# Patient Record
Sex: Male | Born: 1975 | Race: Black or African American | Hispanic: No | Marital: Married | Smoking: Current some day smoker
Health system: Southern US, Community
[De-identification: ages and names within clinical notes are randomized; demographics above are authoritative.]

## PROBLEM LIST (undated history)

## (undated) DIAGNOSIS — K219 Gastro-esophageal reflux disease without esophagitis: Secondary | ICD-10-CM

## (undated) DIAGNOSIS — K222 Esophageal obstruction: Secondary | ICD-10-CM

## (undated) HISTORY — DX: Gastro-esophageal reflux disease without esophagitis: K21.9

## (undated) HISTORY — DX: Esophageal obstruction: K22.2

## (undated) HISTORY — PX: HAND SURGERY: SHX662

## (undated) HISTORY — PX: HEMORRHOID SURGERY: SHX153

## (undated) HISTORY — PX: ESOPHAGEAL DILATION: SHX303

---

## 2014-08-27 ENCOUNTER — Telehealth: Payer: Self-pay | Admitting: Behavioral Health

## 2014-08-27 ENCOUNTER — Encounter: Payer: Self-pay | Admitting: Behavioral Health

## 2014-08-27 NOTE — Telephone Encounter (Signed)
Pre-Visit Call completed with patient and chart updated.   Pre-Visit Info documented in Specialty Comments under [employer].    

## 2014-08-28 ENCOUNTER — Encounter: Payer: Self-pay | Admitting: Physician Assistant

## 2014-08-28 ENCOUNTER — Ambulatory Visit (INDEPENDENT_AMBULATORY_CARE_PROVIDER_SITE_OTHER): Payer: 59 | Admitting: Physician Assistant

## 2014-08-28 VITALS — BP 106/74 | HR 64 | Temp 97.7°F | Resp 16 | Ht 73.0 in | Wt 233.0 lb

## 2014-08-28 DIAGNOSIS — K219 Gastro-esophageal reflux disease without esophagitis: Secondary | ICD-10-CM | POA: Insufficient documentation

## 2014-08-28 DIAGNOSIS — Z125 Encounter for screening for malignant neoplasm of prostate: Secondary | ICD-10-CM | POA: Diagnosis not present

## 2014-08-28 DIAGNOSIS — Z0000 Encounter for general adult medical examination without abnormal findings: Secondary | ICD-10-CM | POA: Diagnosis not present

## 2014-08-28 DIAGNOSIS — Z8719 Personal history of other diseases of the digestive system: Secondary | ICD-10-CM | POA: Insufficient documentation

## 2014-08-28 LAB — COMPREHENSIVE METABOLIC PANEL
ALBUMIN: 4.2 g/dL (ref 3.5–5.2)
ALK PHOS: 61 U/L (ref 39–117)
ALT: 36 U/L (ref 0–53)
AST: 22 U/L (ref 0–37)
BUN: 16 mg/dL (ref 6–23)
CALCIUM: 9.2 mg/dL (ref 8.4–10.5)
CHLORIDE: 102 meq/L (ref 96–112)
CO2: 28 mEq/L (ref 19–32)
CREATININE: 1 mg/dL (ref 0.40–1.50)
GFR: 106.02 mL/min (ref >60.00)
Glucose, Bld: 95 mg/dL (ref 70–99)
POTASSIUM: 3.8 meq/L (ref 3.5–5.1)
Sodium: 136 mEq/L (ref 135–145)
TOTAL PROTEIN: 7 g/dL (ref 6.0–8.3)
Total Bilirubin: 0.5 mg/dL (ref 0.2–1.2)

## 2014-08-28 LAB — LIPID PANEL
CHOL/HDL RATIO: 5
Cholesterol: 174 mg/dL (ref 0–200)
HDL: 37.7 mg/dL — AB (ref >39.00)
LDL Cholesterol: 99 mg/dL (ref 0–99)
NONHDL: 136.74
Triglycerides: 189 mg/dL — ABNORMAL HIGH (ref 0.0–149.0)
VLDL: 37.8 mg/dL (ref 0.0–40.0)

## 2014-08-28 LAB — CBC
HEMATOCRIT: 41.8 % (ref 39.0–52.0)
Hemoglobin: 14.2 g/dL (ref 13.0–17.0)
MCHC: 34.1 g/dL (ref 30.0–36.0)
MCV: 93.5 fl (ref 78.0–100.0)
PLATELETS: 223 10*3/uL (ref 150.0–400.0)
RBC: 4.47 Mil/uL (ref 4.22–5.81)
RDW: 12.5 % (ref 11.5–15.5)
WBC: 4.9 10*3/uL (ref 4.0–10.5)

## 2014-08-28 LAB — PSA: PSA: 0.71 ng/mL (ref 0.10–4.00)

## 2014-08-28 LAB — TSH: TSH: 1.46 u[IU]/mL (ref 0.35–4.50)

## 2014-08-28 LAB — HEMOGLOBIN A1C: HEMOGLOBIN A1C: 4.7 % (ref 4.6–6.5)

## 2014-08-28 NOTE — Patient Instructions (Signed)
Please go to the lab for blood work.  I will call you with your results. If your blood work is normal we will follow-up yearly for physicals.  If anything is abnormal, we will treat you and get you in for a follow-up visit.  Please continue the Prilosec as directed.  Preventive Care for Adults A healthy lifestyle and preventive care can promote health and wellness. Preventive health guidelines for men include the following key practices:  A routine yearly physical is a good way to check with your health care provider about your health and preventative screening. It is a chance to share any concerns and updates on your health and to receive a thorough exam.  Visit your dentist for a routine exam and preventative care every 6 months. Brush your teeth twice a day and floss once a day. Good oral hygiene prevents tooth decay and gum disease.  The frequency of eye exams is based on your age, health, family medical history, use of contact lenses, and other factors. Follow your health care provider's recommendations for frequency of eye exams.  Eat a healthy diet. Foods such as vegetables, fruits, whole grains, low-fat dairy products, and lean protein foods contain the nutrients you need without too many calories. Decrease your intake of foods high in solid fats, added sugars, and salt. Eat the right amount of calories for you.Get information about a proper diet from your health care provider, if necessary.  Regular physical exercise is one of the most important things you can do for your health. Most adults should get at least 150 minutes of moderate-intensity exercise (any activity that increases your heart rate and causes you to sweat) each week. In addition, most adults need muscle-strengthening exercises on 2 or more days a week.  Maintain a healthy weight. The body mass index (BMI) is a screening tool to identify possible weight problems. It provides an estimate of body fat based on height and  weight. Your health care provider can find your BMI and can help you achieve or maintain a healthy weight.For adults 20 years and older:  A BMI below 18.5 is considered underweight.  A BMI of 18.5 to 24.9 is normal.  A BMI of 25 to 29.9 is considered overweight.  A BMI of 30 and above is considered obese.  Maintain normal blood lipids and cholesterol levels by exercising and minimizing your intake of saturated fat. Eat a balanced diet with plenty of fruit and vegetables. Blood tests for lipids and cholesterol should begin at age 48 and be repeated every 5 years. If your lipid or cholesterol levels are high, you are over 50, or you are at high risk for heart disease, you may need your cholesterol levels checked more frequently.Ongoing high lipid and cholesterol levels should be treated with medicines if diet and exercise are not working.  If you smoke, find out from your health care provider how to quit. If you do not use tobacco, do not start.  Lung cancer screening is recommended for adults aged 36-80 years who are at high risk for developing lung cancer because of a history of smoking. A yearly low-dose CT scan of the lungs is recommended for people who have at least a 30-pack-year history of smoking and are a current smoker or have quit within the past 15 years. A pack year of smoking is smoking an average of 1 pack of cigarettes a day for 1 year (for example: 1 pack a day for 30 years or 2 packs  a day for 15 years). Yearly screening should continue until the smoker has stopped smoking for at least 15 years. Yearly screening should be stopped for people who develop a health problem that would prevent them from having lung cancer treatment.  If you choose to drink alcohol, do not have more than 2 drinks per day. One drink is considered to be 12 ounces (355 mL) of beer, 5 ounces (148 mL) of wine, or 1.5 ounces (44 mL) of liquor.  Avoid use of street drugs. Do not share needles with anyone. Ask  for help if you need support or instructions about stopping the use of drugs.  High blood pressure causes heart disease and increases the risk of stroke. Your blood pressure should be checked at least every 1-2 years. Ongoing high blood pressure should be treated with medicines, if weight loss and exercise are not effective.  If you are 64-44 years old, ask your health care provider if you should take aspirin to prevent heart disease.  Diabetes screening involves taking a blood sample to check your fasting blood sugar level. This should be done once every 3 years, after age 25, if you are within normal weight and without risk factors for diabetes. Testing should be considered at a younger age or be carried out more frequently if you are overweight and have at least 1 risk factor for diabetes.  Colorectal cancer can be detected and often prevented. Most routine colorectal cancer screening begins at the age of 64 and continues through age 43. However, your health care provider may recommend screening at an earlier age if you have risk factors for colon cancer. On a yearly basis, your health care provider may provide home test kits to check for hidden blood in the stool. Use of a small camera at the end of a tube to directly examine the colon (sigmoidoscopy or colonoscopy) can detect the earliest forms of colorectal cancer. Talk to your health care provider about this at age 58, when routine screening begins. Direct exam of the colon should be repeated every 5-10 years through age 49, unless early forms of precancerous polyps or small growths are found.  People who are at an increased risk for hepatitis B should be screened for this virus. You are considered at high risk for hepatitis B if:  You were born in a country where hepatitis B occurs often. Talk with your health care provider about which countries are considered high risk.  Your parents were born in a high-risk country and you have not received a  shot to protect against hepatitis B (hepatitis B vaccine).  You have HIV or AIDS.  You use needles to inject street drugs.  You live with, or have sex with, someone who has hepatitis B.  You are a man who has sex with other men (MSM).  You get hemodialysis treatment.  You take certain medicines for conditions such as cancer, organ transplantation, and autoimmune conditions.  Hepatitis C blood testing is recommended for all people born from [birth year] through 1965 and any individual with known risks for hepatitis C.  Practice safe sex. Use condoms and avoid high-risk sexual practices to reduce the spread of sexually transmitted infections (STIs). STIs include gonorrhea, chlamydia, syphilis, trichomonas, herpes, HPV, and human immunodeficiency virus (HIV). Herpes, HIV, and HPV are viral illnesses that have no cure. They can result in disability, cancer, and death.  If you are at risk of being infected with HIV, it is recommended that you take a  prescription medicine daily to prevent HIV infection. This is called preexposure prophylaxis (PrEP). You are considered at risk if:  You are a man who has sex with other men (MSM) and have other risk factors.  You are a heterosexual man, are sexually active, and are at increased risk for HIV infection.  You take drugs by injection.  You are sexually active with a partner who has HIV.  Talk with your health care provider about whether you are at high risk of being infected with HIV. If you choose to begin PrEP, you should first be tested for HIV. You should then be tested every 3 months for as long as you are taking PrEP.  A one-time screening for abdominal aortic aneurysm (AAA) and surgical repair of large AAAs by ultrasound are recommended for men ages 49 to 45 years who are current or former smokers.  Healthy men should no longer receive prostate-specific antigen (PSA) blood tests as part of routine cancer screening. Talk with your health care  provider about prostate cancer screening.  Testicular cancer screening is not recommended for adult males who have no symptoms. Screening includes self-exam, a health care provider exam, and other screening tests. Consult with your health care provider about any symptoms you have or any concerns you have about testicular cancer.  Use sunscreen. Apply sunscreen liberally and repeatedly throughout the day. You should seek shade when your shadow is shorter than you. Protect yourself by wearing long sleeves, pants, a wide-brimmed hat, and sunglasses year round, whenever you are outdoors.  Once a month, do a whole-body skin exam, using a mirror to look at the skin on your back. Tell your health care provider about new moles, moles that have irregular borders, moles that are larger than a pencil eraser, or moles that have changed in shape or color.  Stay current with required vaccines (immunizations).  Influenza vaccine. All adults should be immunized every year.  Tetanus, diphtheria, and acellular pertussis (Td, Tdap) vaccine. An adult who has not previously received Tdap or who does not know his vaccine status should receive 1 dose of Tdap. This initial dose should be followed by tetanus and diphtheria toxoids (Td) booster doses every 10 years. Adults with an unknown or incomplete history of completing a 3-dose immunization series with Td-containing vaccines should begin or complete a primary immunization series including a Tdap dose. Adults should receive a Td booster every 10 years.  Varicella vaccine. An adult without evidence of immunity to varicella should receive 2 doses or a second dose if he has previously received 1 dose.  Human papillomavirus (HPV) vaccine. Males aged 33-21 years who have not received the vaccine previously should receive the 3-dose series. Males aged 22-26 years may be immunized. Immunization is recommended through the age of 38 years for any male who has sex with males and  did not get any or all doses earlier. Immunization is recommended for any person with an immunocompromised condition through the age of 78 years if he did not get any or all doses earlier. During the 3-dose series, the second dose should be obtained 4-8 weeks after the first dose. The third dose should be obtained 24 weeks after the first dose and 16 weeks after the second dose.  Zoster vaccine. One dose is recommended for adults aged 74 years or older unless certain conditions are present.  Measles, mumps, and rubella (MMR) vaccine. Adults born before [birth year] generally are considered immune to measles and mumps. Adults born in [birth year]  or later should have 1 or more doses of MMR vaccine unless there is a contraindication to the vaccine or there is laboratory evidence of immunity to each of the three diseases. A routine second dose of MMR vaccine should be obtained at least 28 days after the first dose for students attending postsecondary schools, health care workers, or international travelers. People who received inactivated measles vaccine or an unknown type of measles vaccine during 1963-1967 should receive 2 doses of MMR vaccine. People who received inactivated mumps vaccine or an unknown type of mumps vaccine before 1979 and are at high risk for mumps infection should consider immunization with 2 doses of MMR vaccine. Unvaccinated health care workers born before [birth year] who lack laboratory evidence of measles, mumps, or rubella immunity or laboratory confirmation of disease should consider measles and mumps immunization with 2 doses of MMR vaccine or rubella immunization with 1 dose of MMR vaccine.  Pneumococcal 13-valent conjugate (PCV13) vaccine. When indicated, a person who is uncertain of his immunization history and has no record of immunization should receive the PCV13 vaccine. An adult aged 17 years or older who has certain medical conditions and has not been previously immunized should receive 1 dose of  PCV13 vaccine. This PCV13 should be followed with a dose of pneumococcal polysaccharide (PPSV23) vaccine. The PPSV23 vaccine dose should be obtained at least 8 weeks after the dose of PCV13 vaccine. An adult aged 5 years or older who has certain medical conditions and previously received 1 or more doses of PPSV23 vaccine should receive 1 dose of PCV13. The PCV13 vaccine dose should be obtained 1 or more years after the last PPSV23 vaccine dose.  Pneumococcal polysaccharide (PPSV23) vaccine. When PCV13 is also indicated, PCV13 should be obtained first. All adults aged 49 years and older should be immunized. An adult younger than age 59 years who has certain medical conditions should be immunized. Any person who resides in a nursing home or long-term care facility should be immunized. An adult smoker should be immunized. People with an immunocompromised condition and certain other conditions should receive both PCV13 and PPSV23 vaccines. People with human immunodeficiency virus (HIV) infection should be immunized as soon as possible after diagnosis. Immunization during chemotherapy or radiation therapy should be avoided. Routine use of PPSV23 vaccine is not recommended for American Indians, [place] Natives, or people younger than 65 years unless there are medical conditions that require PPSV23 vaccine. When indicated, people who have unknown immunization and have no record of immunization should receive PPSV23 vaccine. One-time revaccination 5 years after the first dose of PPSV23 is recommended for people aged 19-64 years who have chronic kidney failure, nephrotic syndrome, asplenia, or immunocompromised conditions. People who received 1-2 doses of PPSV23 before age 68 years should receive another dose of PPSV23 vaccine at age 34 years or later if at least 5 years have passed since the previous dose. Doses of PPSV23 are not needed for people immunized with PPSV23 at or after age 44 years.  Meningococcal vaccine.  Adults with asplenia or persistent complement component deficiencies should receive 2 doses of quadrivalent meningococcal conjugate (MenACWY-D) vaccine. The doses should be obtained at least 2 months apart. Microbiologists working with certain meningococcal bacteria, [place] recruits, people at risk during an outbreak, and people who travel to or live in countries with a high rate of meningitis should be immunized. A first-year college student up through age 11 years who is living in a residence hall should receive a dose if he  did not receive a dose on or after his 16th birthday. Adults who have certain high-risk conditions should receive one or more doses of vaccine.  Hepatitis A vaccine. Adults who wish to be protected from this disease, have certain high-risk conditions, work with hepatitis A-infected animals, work in hepatitis A research labs, or travel to or work in countries with a high rate of hepatitis A should be immunized. Adults who were previously unvaccinated and who anticipate close contact with an international adoptee during the first 60 days after arrival in the [place] from a country with a high rate of hepatitis A should be immunized.  Hepatitis B vaccine. Adults should be immunized if they wish to be protected from this disease, have certain high-risk conditions, may be exposed to blood or other infectious body fluids, are household contacts or sex partners of hepatitis B positive people, are clients or workers in certain care facilities, or travel to or work in countries with a high rate of hepatitis B.  Haemophilus influenzae type b (Hib) vaccine. A previously unvaccinated person with asplenia or sickle cell disease or having a scheduled splenectomy should receive 1 dose of Hib vaccine. Regardless of previous immunization, a recipient of a hematopoietic stem cell transplant should receive a 3-dose series 6-12 months after his successful transplant. Hib vaccine is not recommended  for adults with HIV infection. Preventive Service / Frequency Ages 34 to 34  Blood pressure check.** / Every 1 to 2 years.  Lipid and cholesterol check.** / Every 5 years beginning at age 76.  Hepatitis C blood test.** / For any individual with known risks for hepatitis C.  Skin self-exam. / Monthly.  Influenza vaccine. / Every year.  Tetanus, diphtheria, and acellular pertussis (Tdap, Td) vaccine.** / Consult your health care provider. 1 dose of Td every 10 years.  Varicella vaccine.** / Consult your health care provider.  HPV vaccine. / 3 doses over 6 months, if 41 or younger.  Measles, mumps, rubella (MMR) vaccine.** / You need at least 1 dose of MMR if you were born in 1957 or later. You may also need a second dose.  Pneumococcal 13-valent conjugate (PCV13) vaccine.** / Consult your health care provider.  Pneumococcal polysaccharide (PPSV23) vaccine.** / 1 to 2 doses if you smoke cigarettes or if you have certain conditions.  Meningococcal vaccine.** / 1 dose if you are age 14 to 59 years and a Market researcher living in a residence hall, or have one of several medical conditions. You may also need additional booster doses.  Hepatitis A vaccine.** / Consult your health care provider.  Hepatitis B vaccine.** / Consult your health care provider.  Haemophilus influenzae type b (Hib) vaccine.** / Consult your health care provider. Ages 44 to 68  Blood pressure check.** / Every 1 to 2 years.  Lipid and cholesterol check.** / Every 5 years beginning at age 56.  Lung cancer screening. / Every year if you are aged 5-80 years and have a 30-pack-year history of smoking and currently smoke or have quit within the past 15 years. Yearly screening is stopped once you have quit smoking for at least 15 years or develop a health problem that would prevent you from having lung cancer treatment.  Fecal occult blood test (FOBT) of stool. / Every year beginning at age 13 and  continuing until age 39. You may not have to do this test if you get a colonoscopy every 10 years.  Flexible sigmoidoscopy** or colonoscopy.** / Every 5  years for a flexible sigmoidoscopy or every 10 years for a colonoscopy beginning at age 42 and continuing until age 75.  Hepatitis C blood test.** / For all people born from [birth year] through 1965 and any individual with known risks for hepatitis C.  Skin self-exam. / Monthly.  Influenza vaccine. / Every year.  Tetanus, diphtheria, and acellular pertussis (Tdap/Td) vaccine.** / Consult your health care provider. 1 dose of Td every 10 years.  Varicella vaccine.** / Consult your health care provider.  Zoster vaccine.** / 1 dose for adults aged 29 years or older.  Measles, mumps, rubella (MMR) vaccine.** / You need at least 1 dose of MMR if you were born in 1959 or later. You may also need a second dose.  Pneumococcal 13-valent conjugate (PCV13) vaccine.** / Consult your health care provider.  Pneumococcal polysaccharide (PPSV23) vaccine.** / 1 to 2 doses if you smoke cigarettes or if you have certain conditions.  Meningococcal vaccine.** / Consult your health care provider.  Hepatitis A vaccine.** / Consult your health care provider.  Hepatitis B vaccine.** / Consult your health care provider.  Haemophilus influenzae type b (Hib) vaccine.** / Consult your health care provider. Ages 59 and over  Blood pressure check.** / Every 1 to 2 years.  Lipid and cholesterol check.**/ Every 5 years beginning at age 66.  Lung cancer screening. / Every year if you are aged 61-80 years and have a 30-pack-year history of smoking and currently smoke or have quit within the past 15 years. Yearly screening is stopped once you have quit smoking for at least 15 years or develop a health problem that would prevent you from having lung cancer treatment.  Fecal occult blood test (FOBT) of stool. / Every year beginning at age 25 and continuing until age 57. You  may not have to do this test if you get a colonoscopy every 10 years.  Flexible sigmoidoscopy** or colonoscopy.** / Every 5 years for a flexible sigmoidoscopy or every 10 years for a colonoscopy beginning at age 47 and continuing until age 37.  Hepatitis C blood test.** / For all people born from [birth year] through 1967 and any individual with known risks for hepatitis C.  Abdominal aortic aneurysm (AAA) screening.** / A one-time screening for ages 59 to 72 years who are current or former smokers.  Skin self-exam. / Monthly.  Influenza vaccine. / Every year.  Tetanus, diphtheria, and acellular pertussis (Tdap/Td) vaccine.** / 1 dose of Td every 10 years.  Varicella vaccine.** / Consult your health care provider.  Zoster vaccine.** / 1 dose for adults aged 87 years or older.  Pneumococcal 13-valent conjugate (PCV13) vaccine.** / Consult your health care provider.  Pneumococcal polysaccharide (PPSV23) vaccine.** / 1 dose for all adults aged 30 years and older.  Meningococcal vaccine.** / Consult your health care provider.  Hepatitis A vaccine.** / Consult your health care provider.  Hepatitis B vaccine.** / Consult your health care provider.  Haemophilus influenzae type b (Hib) vaccine.** / Consult your health care provider. **Family history and personal history of risk and conditions may change your health care provider's recommendations. Document Released: 02/23/2001 Document Revised: 01/02/2013 Document Reviewed: 05/25/2010 [facility] Patient Information 2015 [place], [place]. This information is not intended to replace advice given to you by your health care provider. Make sure you discuss any questions you have with your health care provider.

## 2014-08-28 NOTE — Progress Notes (Signed)
Patient presents to clinic today to establish care. Patient currently on Prilosec as needed for GERD with good relief in symptoms. Is requesting CPE. Is fasting for labs.   Health Maintenance: Dental -- up-to-date Vision -- up-to-date Immunizations -- Tetanus up-to-date per patient. Will obtain immunization records. Colonoscopy -- 2012 - Hemorrhoids only.  Past Medical History  Diagnosis Date  . GERD (gastroesophageal reflux disease)   . Esophageal stricture     Past Surgical History  Procedure Laterality Date  . Hand surgery Right   . Hemorrhoid surgery    . Esophageal dilation      Current Outpatient Prescriptions on File Prior to Visit  Medication Sig Dispense Refill  . OMEPRAZOLE PO Take by mouth daily.     No current facility-administered medications on file prior to visit.    No Known Allergies  Family History  Problem Relation Age of Onset  . Healthy Child     x 2    Social History   Social History  . Marital Status: Married    Spouse Name: N/A  . Number of Children: 2  . Years of Education: N/A   Occupational History  . Education    Social History Main Topics  . Smoking status: Current Some Day Smoker -- 0.01 packs/day for 20 years    Types: Cigarettes  . Smokeless tobacco: Never Used  . Alcohol Use: 0.0 oz/week    0 Standard drinks or equivalent per week     Comment: rare  . Drug Use: No  . Sexual Activity:    Partners: Female   Other Topics Concern  . Not on file   Social History Narrative   Review of Systems  Constitutional: Negative for fever and weight loss.  HENT: Negative for ear discharge, ear pain, hearing loss and tinnitus.   Eyes: Negative for blurred vision, double vision, photophobia and pain.  Respiratory: Negative for cough and shortness of breath.   Cardiovascular: Negative for chest pain and palpitations.  Gastrointestinal: Negative for heartburn, nausea, vomiting, abdominal pain, diarrhea, constipation, blood in  stool and melena.  Genitourinary: Negative for dysuria, urgency, frequency, hematuria and flank pain.  Musculoskeletal: Negative for falls.  Neurological: Negative for dizziness, loss of consciousness and headaches.  Endo/Heme/Allergies: Negative for environmental allergies.  Psychiatric/Behavioral: Negative for depression, suicidal ideas, hallucinations and substance abuse. The patient is not nervous/anxious and does not have insomnia.     BP 106/74 mmHg  Pulse 64  Temp(Src) 97.7 F (36.5 C) (Oral)  Resp 16  Ht  (1.854 m)  Wt 233 lb (105.688 kg)  BMI 30.75 kg/m2  SpO2 98%  Physical Exam  Constitutional: He is oriented to person, place, and time and well-developed, well-nourished, and in no distress.  HENT:  Head: Normocephalic and atraumatic.  Right Ear: External ear normal.  Left Ear: External ear normal.  Nose: Nose normal.  Mouth/Throat: Oropharynx is clear and moist. No oropharyngeal exudate.  Eyes: Conjunctivae and EOM are normal. Pupils are equal, round, and reactive to light.  Neck: Neck supple. No thyromegaly present.  Cardiovascular: Normal rate, regular rhythm, normal heart sounds and intact distal pulses.   Pulmonary/Chest: Effort normal and breath sounds normal. No respiratory distress. He has no wheezes. He has no rales. He exhibits no tenderness.  Abdominal: Soft. Bowel sounds are normal. He exhibits no distension and no mass. There is no tenderness. There is no rebound and no guarding.  Genitourinary: Testes/scrotum normal.  Deferred.  Lymphadenopathy:    He  has no cervical adenopathy.  Neurological: He is alert and oriented to person, place, and time.  Skin: Skin is warm and dry. No rash noted.  Psychiatric: Affect normal.  Vitals reviewed.  Assessment/Plan: Prostate cancer screening DRE deferred. Asymptomatic. Will obtain PSA today.  Visit for preventive health examination Depression screen negative. Health Maintenance reviewed -- Declines tetanus.  Colonoscopy 2012. Preventive schedule discussed and handout given in AVS. Will obtain fasting labs today.

## 2014-08-28 NOTE — Progress Notes (Signed)
Pre visit review using our clinic review tool, if applicable. No additional management support is needed unless otherwise documented below in the visit note. 

## 2014-08-29 LAB — URINALYSIS, ROUTINE W REFLEX MICROSCOPIC
Bilirubin Urine: NEGATIVE
HGB URINE DIPSTICK: NEGATIVE
Ketones, ur: NEGATIVE
LEUKOCYTES UA: NEGATIVE
NITRITE: NEGATIVE
PH: 6.5 (ref 5.0–8.0)
RBC / HPF: NONE SEEN (ref 0–?)
Specific Gravity, Urine: 1.02 (ref 1.000–1.030)
TOTAL PROTEIN, URINE-UPE24: NEGATIVE
URINE GLUCOSE: NEGATIVE
Urobilinogen, UA: 0.2 (ref 0.0–1.0)
WBC, UA: NONE SEEN (ref 0–?)

## 2014-08-31 ENCOUNTER — Encounter: Payer: Self-pay | Admitting: Physician Assistant

## 2014-08-31 DIAGNOSIS — Z0000 Encounter for general adult medical examination without abnormal findings: Secondary | ICD-10-CM | POA: Insufficient documentation

## 2014-08-31 DIAGNOSIS — Z125 Encounter for screening for malignant neoplasm of prostate: Secondary | ICD-10-CM | POA: Insufficient documentation

## 2014-08-31 NOTE — Assessment & Plan Note (Signed)
Depression screen negative. Health Maintenance reviewed -- Declines tetanus. Colonoscopy 2012. Preventive schedule discussed and handout given in AVS. Will obtain fasting labs today.

## 2014-08-31 NOTE — Assessment & Plan Note (Signed)
DRE deferred. Asymptomatic. Will obtain PSA today.

## 2016-03-12 ENCOUNTER — Ambulatory Visit (INDEPENDENT_AMBULATORY_CARE_PROVIDER_SITE_OTHER): Payer: BLUE CROSS/BLUE SHIELD | Admitting: Physician Assistant

## 2016-03-12 ENCOUNTER — Encounter: Payer: Self-pay | Admitting: Physician Assistant

## 2016-03-12 VITALS — BP 118/72 | HR 73 | Ht 73.0 in | Wt 233.0 lb

## 2016-03-12 DIAGNOSIS — Z6830 Body mass index (BMI) 30.0-30.9, adult: Secondary | ICD-10-CM

## 2016-03-12 DIAGNOSIS — E6609 Other obesity due to excess calories: Secondary | ICD-10-CM

## 2016-03-12 MED ORDER — PHENTERMINE HCL 37.5 MG PO TABS: 37.5000 mg | ORAL_TABLET | Freq: Every day | ORAL | 0 refills | Status: DC

## 2016-03-14 ENCOUNTER — Encounter: Payer: Self-pay | Admitting: Physician Assistant

## 2016-03-14 DIAGNOSIS — Z6830 Body mass index (BMI) 30.0-30.9, adult: Secondary | ICD-10-CM | POA: Insufficient documentation

## 2016-03-14 NOTE — Progress Notes (Signed)
   Subjective:    Patient ID: Jeffrey Gregory, male    DOB: July 10, 1975, 41 y.o.   MRN: [id number]  HPI Pt is a 41 yo male who presents to the clinic to discuss weight loss. He has been working out 3-5 times a week and limiting carbs/sugars with no weight loss. He would like help. Denies any hx of cardiac arrhthymias, thyroid issues.    Review of Systems  All other systems reviewed and are negative.      Objective:   Physical Exam  Constitutional: He is oriented to person, place, and time. He appears well-developed and well-nourished.  HENT:  Head: Normocephalic and atraumatic.  Cardiovascular: Normal rate, regular rhythm and normal heart sounds.   Pulmonary/Chest: Effort normal and breath sounds normal.  Neurological: He is alert and oriented to person, place, and time.  Psychiatric: He has a normal mood and affect. His behavior is normal.          Assessment & Plan:  Marland Kitchen.Marland Kitchen.Diagnoses and all orders for this visit:  Class 1 obesity due to excess calories without serious comorbidity in adult, unspecified BMI -     phentermine (ADIPEX-P) 37.5 MG tablet; Take 1 tablet (37.5 mg total) by mouth daily before breakfast.  BMI 30.0-30.9,adult -     phentermine (ADIPEX-P) 37.5 MG tablet; Take 1 tablet (37.5 mg total) by mouth daily before breakfast.   Discussed medication doses and side effects.  Follow up in 1 month nurse visit. Discussed diet and exercise.

## 2016-04-16 ENCOUNTER — Ambulatory Visit: Payer: BLUE CROSS/BLUE SHIELD

## 2016-04-20 ENCOUNTER — Ambulatory Visit (INDEPENDENT_AMBULATORY_CARE_PROVIDER_SITE_OTHER): Payer: BLUE CROSS/BLUE SHIELD | Admitting: Physician Assistant

## 2016-04-20 DIAGNOSIS — Z6830 Body mass index (BMI) 30.0-30.9, adult: Secondary | ICD-10-CM | POA: Diagnosis not present

## 2016-04-20 DIAGNOSIS — E6609 Other obesity due to excess calories: Secondary | ICD-10-CM

## 2016-04-20 MED ORDER — PHENTERMINE HCL 37.5 MG PO TABS: 37.5000 mg | ORAL_TABLET | Freq: Every day | ORAL | 0 refills | Status: DC

## 2016-04-20 NOTE — Progress Notes (Signed)
Pt is here for BP and weight check.  Denies trouble sleeping, palpitations, and medication problems.   Patient has lost weight.  A refill for phentermine will be faxed to the pharmacy.  Pt advised to schedule a follow up with nurse in 30 days.

## 2016-06-04 ENCOUNTER — Other Ambulatory Visit: Payer: Self-pay | Admitting: Physician Assistant

## 2016-06-04 DIAGNOSIS — E6609 Other obesity due to excess calories: Secondary | ICD-10-CM

## 2016-06-04 DIAGNOSIS — Z6830 Body mass index (BMI) 30.0-30.9, adult: Secondary | ICD-10-CM

## 2016-06-04 MED ORDER — PHENTERMINE HCL 37.5 MG PO TABS: 37.5000 mg | ORAL_TABLET | Freq: Every day | ORAL | 0 refills | Status: DC

## 2016-06-04 NOTE — Progress Notes (Signed)
Pt needs refill on phentermine. Doing well. No side effects. Losing weight. Down 6lbs. No side effects. Will refill for one month. Tandy GawJade Josmar Messimer PA_C

## 2016-11-09 ENCOUNTER — Ambulatory Visit (INDEPENDENT_AMBULATORY_CARE_PROVIDER_SITE_OTHER): Payer: 59

## 2016-11-09 ENCOUNTER — Encounter: Payer: Self-pay | Admitting: Sports Medicine

## 2016-11-09 ENCOUNTER — Ambulatory Visit (INDEPENDENT_AMBULATORY_CARE_PROVIDER_SITE_OTHER): Payer: 59 | Admitting: Sports Medicine

## 2016-11-09 DIAGNOSIS — Y9367 Activity, basketball: Secondary | ICD-10-CM | POA: Diagnosis not present

## 2016-11-09 DIAGNOSIS — B351 Tinea unguium: Secondary | ICD-10-CM

## 2016-11-09 DIAGNOSIS — S6991XA Unspecified injury of right wrist, hand and finger(s), initial encounter: Secondary | ICD-10-CM | POA: Diagnosis not present

## 2016-11-09 MED ORDER — MELOXICAM 15 MG PO TABS: ORAL_TABLET | ORAL | 3 refills | Status: DC

## 2016-11-09 MED ORDER — TERBINAFINE HCL 250 MG PO TABS: 250.0000 mg | ORAL_TABLET | Freq: Every day | ORAL | 1 refills | Status: DC

## 2016-11-09 NOTE — Progress Notes (Signed)
   Subjective:    I'm seeing this patient as a consultation for: Tandy GawJade Breeback, PA-C  CC: Right thumb injury  HPI: This is a pleasant 41 year old male, he has a history of a right first metacarpal fracture with ORIF.  While playing basketball about 6 days ago he jammed his thumb, unaware of which direction it went but he developed immediate swelling, pain, no bruising.  Pain is localized at the radial aspect of the first MCP.  He also complains of onychomycosis which responded to several months of Lamisil in the past.  Past medical history, Surgical history, Family history not pertinant except as noted below, Social history, Allergies, and medications have been entered into the medical record, reviewed, and no changes needed.   Review of Systems: No headache, visual changes, nausea, vomiting, diarrhea, constipation, dizziness, abdominal pain, skin rash, fevers, chills, night sweats, weight loss, swollen lymph nodes, body aches, joint swelling, muscle aches, chest pain, shortness of breath, mood changes, visual or auditory hallucinations.   Objective:   General: Well Developed, well nourished, and in no acute distress.  Neuro:  Extra-ocular muscles intact, able to move all 4 extremities, sensation grossly intact.  Deep tendon reflexes tested were normal. Psych: Alert and oriented, mood congruent with affect. ENT:  Ears and nose appear unremarkable.  Hearing grossly normal. Neck: Unremarkable overall appearance, trachea midline.  No visible thyroid enlargement. Eyes: Conjunctivae and lids appear unremarkable.  Pupils equal and round. Skin: Warm and dry, no rashes noted.  Cardiovascular: Pulses palpable, no extremity edema. Right hand: Swollen to thenar eminence, tenderness at the radial collateral ligament of the first MCP with joint laxity, ulnar collateral ligament is intact.  Good strength to flexion and extension at the MCP, as well as the IP joint.  X-rays personally reviewed, no new  fractures, he does have the bony callus from his previous fracture in the distant past.  Impression and Recommendations:   This case required medical decision making of moderate complexity.  Injury of right thumb Injury occurred 6 days ago, suspect radial collateral ligament injury of the right first MCP. Thumb spica brace, meloxicam, x-rays. Continue icing, return to see me in 2 weeks for this.  Onychomycosis Lamisil for 3-6 months. Keep follow-up with Tandy GawJade Breeback PA-C for this.  ___________________________________________ Ihor Austinhomas J. Benjamin Stainhekkekandam, M.D., ABFM., CAQSM. Primary Care and Sports Medicine [facility]  Adjunct Instructor of Family Medicine  [employer]

## 2016-11-09 NOTE — Assessment & Plan Note (Signed)
Lamisil for 3-6 months. Keep follow-up with Tandy GawJade Breeback PA-C for this.

## 2016-11-09 NOTE — Assessment & Plan Note (Signed)
Injury occurred 6 days ago, suspect radial collateral ligament injury of the right first MCP. Thumb spica brace, meloxicam, x-rays. Continue icing, return to see me in 2 weeks for this.

## 2017-06-02 ENCOUNTER — Other Ambulatory Visit: Payer: Self-pay | Admitting: Physician Assistant

## 2017-06-02 DIAGNOSIS — E6609 Other obesity due to excess calories: Secondary | ICD-10-CM

## 2017-06-02 DIAGNOSIS — Z6830 Body mass index (BMI) 30.0-30.9, adult: Secondary | ICD-10-CM

## 2017-06-02 MED ORDER — PHENTERMINE HCL 37.5 MG PO TABS: 37.5000 mg | ORAL_TABLET | Freq: Every day | ORAL | 0 refills | Status: DC

## 2017-06-02 NOTE — Progress Notes (Signed)
BP good. Would like to try phentermine again. Will restart. Follow up in one month for refills.

## 2017-09-07 ENCOUNTER — Other Ambulatory Visit: Payer: Self-pay | Admitting: Physician Assistant

## 2017-09-07 ENCOUNTER — Telehealth: Payer: Self-pay | Admitting: Physician Assistant

## 2017-09-07 DIAGNOSIS — E6609 Other obesity due to excess calories: Secondary | ICD-10-CM

## 2017-09-07 DIAGNOSIS — Z6830 Body mass index (BMI) 30.0-30.9, adult: Secondary | ICD-10-CM

## 2017-09-07 MED ORDER — PHENTERMINE HCL 37.5 MG PO TABS: 37.5000 mg | ORAL_TABLET | Freq: Every day | ORAL | 0 refills | Status: DC

## 2017-09-07 NOTE — Telephone Encounter (Signed)
Lost 18lbs and has 8lbs to go. No side effects. Checking BP at gym and 128/80's.

## 2018-02-08 ENCOUNTER — Telehealth: Payer: Self-pay | Admitting: Physician Assistant

## 2018-02-08 DIAGNOSIS — Z6830 Body mass index (BMI) 30.0-30.9, adult: Secondary | ICD-10-CM

## 2018-02-08 DIAGNOSIS — E6609 Other obesity due to excess calories: Secondary | ICD-10-CM

## 2018-02-08 MED ORDER — PHENTERMINE HCL 37.5 MG PO TABS: 37.5000 mg | ORAL_TABLET | Freq: Every day | ORAL | 0 refills | Status: DC

## 2018-02-08 NOTE — Telephone Encounter (Signed)
Pt has used phentermine in the past to jump start weight loss. Last refill was last summer.   NSR BP was 128/79 Pulse 64.   Starting weight 230.  Pt has tolerated well in the past.   Follow up in 1 month.

## 2018-04-26 ENCOUNTER — Telehealth: Payer: Self-pay | Admitting: Physician Assistant

## 2018-04-26 DIAGNOSIS — Z6830 Body mass index (BMI) 30.0-30.9, adult: Secondary | ICD-10-CM

## 2018-04-26 DIAGNOSIS — E6609 Other obesity due to excess calories: Secondary | ICD-10-CM

## 2018-04-26 MED ORDER — PHENTERMINE HCL 37.5 MG PO TABS: 37.5000 mg | ORAL_TABLET | Freq: Every day | ORAL | 0 refills | Status: DC

## 2018-04-26 NOTE — Telephone Encounter (Signed)
BP 128/79 pulse 64. Weigh 223. Refilled for next month. Discussion about not losing any weight.

## 2019-08-30 ENCOUNTER — Encounter (HOSPITAL_BASED_OUTPATIENT_CLINIC_OR_DEPARTMENT_OTHER): Payer: Self-pay | Admitting: Emergency Medicine

## 2019-08-30 ENCOUNTER — Other Ambulatory Visit: Payer: Self-pay

## 2019-08-30 ENCOUNTER — Emergency Department (HOSPITAL_BASED_OUTPATIENT_CLINIC_OR_DEPARTMENT_OTHER)
Admission: EM | Admit: 2019-08-30 | Discharge: 2019-08-30 | Disposition: A | Discharge status: Home / Self Care | Payer: Managed Care, Other (non HMO) | Attending: Emergency Medicine | Admitting: Emergency Medicine

## 2019-08-30 DIAGNOSIS — M25511 Pain in right shoulder: Secondary | ICD-10-CM | POA: Diagnosis not present

## 2019-08-30 DIAGNOSIS — R202 Paresthesia of skin: Secondary | ICD-10-CM | POA: Insufficient documentation

## 2019-08-30 DIAGNOSIS — M50122 Cervical disc disorder at C5-C6 level with radiculopathy: Secondary | ICD-10-CM | POA: Insufficient documentation

## 2019-08-30 DIAGNOSIS — M5412 Radiculopathy, cervical region: Secondary | ICD-10-CM

## 2019-08-30 DIAGNOSIS — M79603 Pain in arm, unspecified: Secondary | ICD-10-CM | POA: Diagnosis present

## 2019-08-30 DIAGNOSIS — M50123 Cervical disc disorder at C6-C7 level with radiculopathy: Secondary | ICD-10-CM | POA: Diagnosis not present

## 2019-08-30 DIAGNOSIS — F17210 Nicotine dependence, cigarettes, uncomplicated: Secondary | ICD-10-CM | POA: Insufficient documentation

## 2019-08-30 MED ORDER — OXYCODONE-ACETAMINOPHEN 10-325 MG PO TABS: 1.0000 | ORAL_TABLET | Freq: Four times a day (QID) | ORAL | 0 refills | Status: AC | PRN

## 2019-08-30 MED ORDER — NAPROXEN 375 MG PO TABS: ORAL_TABLET | ORAL | 0 refills | Status: AC

## 2019-08-30 MED ORDER — NAPROXEN 250 MG PO TABS
500.0000 mg | ORAL_TABLET | Freq: Once | ORAL | Status: AC
  Administered 2019-08-30: 500 mg via ORAL
  Filled 2019-08-30: qty 2

## 2019-08-30 MED ORDER — OXYCODONE-ACETAMINOPHEN 5-325 MG PO TABS
2.0000 | ORAL_TABLET | Freq: Once | ORAL | Status: AC
  Administered 2019-08-30: 2 via ORAL
  Filled 2019-08-30: qty 2

## 2019-08-30 NOTE — ED Provider Notes (Signed)
[facility] Provider Note: Lowella Dell, MD, FACEP  CSN: [id number] MRN: [id number] ARRIVAL: 08/30/19 at 0333 ROOM: [facility]10/[facility]10   CHIEF COMPLAINT  Arm Pain   HISTORY OF PRESENT ILLNESS  08/30/19 4:46 AM Jeffrey Gregory is a 44 y.o. male with about 2 months of pain on the right side of his neck radiating to his right shoulder and into his right hand.  This is acutely worsened for the past 2 days and he is having paresthesias in the fingers of the C6 and C7 dermatomes.  Pain has been a 10 out of 10 at times but currently he rates it as a 6 out of 10.  It is worse with rotation or leaning of his head to the left.  He has some mild weakness of the right upper extremity as well.  He has had no relief with over-the-counter medications.   Past Medical History:  Diagnosis Date  . Esophageal stricture   . GERD (gastroesophageal reflux disease)     Past Surgical History:  Procedure Laterality Date  . ESOPHAGEAL DILATION    . HAND SURGERY Right   . HEMORRHOID SURGERY      Family History  Problem Relation Age of Onset  . Healthy Child        x 2    Social History   Tobacco Use  . Smoking status: Current Some Day Smoker    Packs/day: 0.01    Years: 20.00    Pack years: 0.20    Types: Cigarettes  . Smokeless tobacco: Never Used  Substance Use Topics  . Alcohol use: Yes    Alcohol/week: 0.0 standard drinks    Comment: rare  . Drug use: No    Prior to Admission medications   Medication Sig Start Date End Date Taking? Authorizing Provider  naproxen (NAPROSYN) 375 MG tablet Take 1 tablet twice daily as needed for pain. 08/30/19   Margi Edmundson, MD  OMEPRAZOLE PO Take by mouth daily.    [provider]  oxyCODONE-acetaminophen (PERCOCET) 10-325 MG tablet Take 1 tablet by mouth every 6 (six) hours as needed (for severe pain). 08/30/19   Olivia Royse, MD  phentermine (ADIPEX-P) 37.5 MG tablet Take 1 tablet (37.5 mg total) by mouth daily before breakfast. 04/26/18    Breeback, Jade L, PA-C  terbinafine (LAMISIL) 250 MG tablet Take 1 tablet (250 mg total) by mouth daily. 11/09/16 08/30/19  Monica Becton, MD    Allergies Patient has no known allergies.   REVIEW OF SYSTEMS  Negative except as noted here or in the History of Present Illness.   PHYSICAL EXAMINATION  Initial Vital Signs Blood pressure (!) 147/101, pulse 69, temperature 97.7 F (36.5 C), temperature source Oral, resp. rate 20, SpO2 99 %.  Examination General: Well-developed, well-nourished male in no acute distress; appearance consistent with age of record HENT: normocephalic; atraumatic Eyes: Normal appearance Neck: supple; rotation or leaning of the head to the left reproduces pain Heart: regular rate and rhythm Lungs: clear to auscultation bilaterally Abdomen: soft; nondistended; nontender; bowel sounds present Extremities: No deformity; full range of motion; pulses normal Neurologic: Awake, alert and oriented; mild flexion weakness of right upper extremity; sensation intact and symmetric in upper extremities; no facial droop Skin: Warm and dry Psychiatric: Normal mood and affect   RESULTS  Summary of this visit's results, reviewed and interpreted by myself:   EKG Interpretation  Date/Time:    Ventricular Rate:    PR Interval:    QRS Duration:  QT Interval:    QTC Calculation:   R Axis:     Text Interpretation:        Laboratory Studies: No results found for this or any previous visit (from the past 24 hour(s)). Imaging Studies: No results found.  ED COURSE and MDM  Nursing notes, initial and subsequent vitals signs, including pulse oximetry, reviewed and interpreted by myself.  Vitals:   08/30/19 0240  BP: (!) 147/101  Pulse: 69  Resp: 20  Temp: 97.7 F (36.5 C)  TempSrc: Oral  SpO2: 99%   Medications  oxyCODONE-acetaminophen (PERCOCET/ROXICET) 5-325 MG per tablet 2 tablet (has no administration in time range)  naproxen (NAPROSYN) tablet  500 mg (has no administration in time range)    Presentation is consistent with C6 and C7 radiculopathy.  We will treat his acute pain and refer to neurosurgery.  PROCEDURES  Procedures   ED DIAGNOSES     ICD-10-CM   1. C6 radiculopathy  M54.12   2. C7 radiculopathy  M54.12        Erandi Lemma, MD 08/30/19 0500

## 2019-08-30 NOTE — ED Triage Notes (Addendum)
Arrives with c/o right arm and shoulder pain for two days, states he slept wrong. Ongoing issue for the last few months  Patient arrived during downtime this morning (0100-0300), arrived at 0240.

## 2019-09-18 ENCOUNTER — Other Ambulatory Visit: Payer: Self-pay | Admitting: Neurological Surgery

## 2019-09-18 DIAGNOSIS — M5412 Radiculopathy, cervical region: Secondary | ICD-10-CM

## 2019-09-25 ENCOUNTER — Ambulatory Visit: Payer: Managed Care, Other (non HMO) | Admitting: Physical Therapy

## 2019-09-27 ENCOUNTER — Other Ambulatory Visit: Payer: Self-pay

## 2019-09-27 ENCOUNTER — Encounter: Payer: Self-pay | Admitting: Physical Therapy

## 2019-09-27 ENCOUNTER — Ambulatory Visit: Payer: Managed Care, Other (non HMO) | Attending: Neurological Surgery | Admitting: Physical Therapy

## 2019-09-27 DIAGNOSIS — M542 Cervicalgia: Secondary | ICD-10-CM | POA: Diagnosis not present

## 2019-09-27 DIAGNOSIS — M25511 Pain in right shoulder: Secondary | ICD-10-CM | POA: Diagnosis present

## 2019-09-27 DIAGNOSIS — R29898 Other symptoms and signs involving the musculoskeletal system: Secondary | ICD-10-CM | POA: Insufficient documentation

## 2019-09-27 DIAGNOSIS — M25611 Stiffness of right shoulder, not elsewhere classified: Secondary | ICD-10-CM | POA: Diagnosis present

## 2019-09-27 NOTE — Therapy (Signed)
[facility] [address]  [address] [place], [place], [address] Phone: [phone]   Fax:  [phone]  Physical Therapy Evaluation  Patient Details  Name: Jeffrey Gregory MRN: [id number] Date of Birth: 1975-06-25 Referring Provider (PT): Monia Pouch, DO   Encounter Date: 09/27/2019   PT End of Session - 09/27/19 1211    Visit Number 1    Number of Visits 7    Date for PT Re-Evaluation 11/08/19    Authorization Type [employer]    PT Start Time 1015    PT Stop Time 1059    PT Time Calculation (min) 44 min    Activity Tolerance Patient tolerated treatment well;Patient limited by pain    Behavior During Therapy [facility] for tasks assessed/performed           Past Medical History:  Diagnosis Date  . Esophageal stricture   . GERD (gastroesophageal reflux disease)     Past Surgical History:  Procedure Laterality Date  . ESOPHAGEAL DILATION    . HAND SURGERY Right   . HEMORRHOID SURGERY      There were no vitals filed for this visit.    Subjective Assessment - 09/27/19 1017    Subjective Patient reports pain from a pinched nerve in his neck since 08/30/19 when he slept with his R arm resting overhead. Also reports hx of R shoulder pain and stiffness d/t sports. Pain was shooting down his R arm, now has centralized to his shoulder. Now dealing with lacking of mobility in his shoulder- feels most limited by stiffness and weakness and notes some atrophy in the muscles in his R arm as well as R grip weakness. Most difficulty with abduction, reaching behind him. Reports N/T down to the R thumb. No longer experiencing radiation of pain. Also notes that yesterday he tried to play basketball and experienced a FOOSH onto the R hand and now having some pain over the R wrist with movement.    Pertinent History GERD, R hand surgery    Limitations Lifting;House hold activities    Diagnostic tests none recent    Patient Stated Goals work on  shoulder mobility    Currently in Pain? Yes    Pain Score 9     Pain Location Shoulder    Pain Orientation Right    Pain Descriptors / Indicators Sore;Discomfort    Pain Type Acute pain;Chronic pain              [facility] PT Assessment - 09/27/19 1023      Assessment   Medical Diagnosis Acute cervical radiculopathy    Referring Provider (PT) Monia Pouch, DO    Onset Date/Surgical Date 08/30/19    Hand Dominance Right    Prior Therapy yes      Precautions   Precautions None      Balance Screen   Has the patient fallen in the past 6 months Yes    How many times? 1   FOOSH yesterday on R arm   Has the patient had a decrease in activity level because of a fear of falling?  No    Is the patient reluctant to leave their home because of a fear of falling?  No      Home Nurse, mental health Private residence    Living Arrangements Spouse/significant other;Children    Available Help at Discharge Family    Type of Home House      Prior Function  Level of Independence Independent    Vocation Part time employment    Vocation Requirements real estate agent- walking, computer work    Leisure basketball, golf, weightlifting      Cognition   Overall Cognitive Status Within Functional Limits for tasks assessed      Observation/Other Assessments-Edema    Edema --   mild R wrist edema over dorsal aspect without bruising     Sensation   Light Touch Impaired by gross assessment   c/o decreased sensation in R thumb     Coordination   Gross Motor Movements are Fluid and Coordinated Yes      Posture/Postural Control   Posture/Postural Control Postural limitations    Postural Limitations Rounded Shoulders      ROM / Strength   AROM / PROM / Strength AROM;Strength      AROM   AROM Assessment Site Cervical;Shoulder    Right/Left Shoulder Right;Left    Right Shoulder Flexion 146 Degrees   discomfort over UT and deltoid   Right Shoulder ABduction 176 Degrees   R shoulder  discomfort   Right Shoulder Internal Rotation --   FIR T10; severe pain   Right Shoulder External Rotation --   FER T1   Left Shoulder Flexion 163 Degrees    Left Shoulder ABduction 179 Degrees    Left Shoulder Internal Rotation --   FIR T3   Left Shoulder External Rotation --   FER T2   Cervical Flexion 37    Cervical Extension 75    Cervical - Right Side Bend 44   R UT discomfort   Cervical - Left Side Bend 38    Cervical - Right Rotation 65    Cervical - Left Rotation 66      Strength   Strength Assessment Site Shoulder;Elbow;Wrist;Hand    Right/Left Shoulder Right;Left    Right Shoulder Flexion 4+/5    Right Shoulder ABduction 4/5    Right Shoulder Internal Rotation 4+/5    Right Shoulder External Rotation 4+/5    Left Shoulder Flexion 4+/5    Left Shoulder ABduction 4+/5    Left Shoulder Internal Rotation 4+/5    Left Shoulder External Rotation 4+/5    Right/Left Elbow Right;Left    Right Elbow Flexion 5/5    Right Elbow Extension 5/5    Left Elbow Flexion 5/5    Left Elbow Extension 5/5    Right/Left Wrist Right;Left    Right Wrist Flexion 4-/5   limited by pain   Right Wrist Extension 4-/5   limited by pain   Left Wrist Flexion 4+/5    Left Wrist Extension 4+/5    Right/Left hand Right;Left    Right Hand Grip (lbs) 51.67   53, 52, 50   Left Hand Grip (lbs) 103.33   105, 105, 100     Palpation   Spinal mobility no TTP wiht gentle palpation over c-spine    Palpation comment Increased soft tissue restriction and TTP over R infraspinatus, proximal biceps tendon, biceps muscle belly, pec, and L LS                      Objective measurements completed on examination: See above findings.               PT Education - 09/27/19 1210    Education Details prognosis, POC, HEP, advised to use ice on R wrist and heat over R deltoid and biceps for pain relief    Person(s)  Educated Patient    Methods Explanation;Demonstration;Tactile cues;Verbal  cues;Handout    Comprehension Verbalized understanding;Returned demonstration            PT Short Term Goals - 09/27/19 1217      PT SHORT TERM GOAL #1   Title Patient to be independent with initial HEP.    Time 3    Period Weeks    Status New    Target Date 10/18/19             PT Long Term Goals - 09/27/19 1217      PT LONG TERM GOAL #1   Title Patient to be independent with advanced HEP.    Time 6    Period Weeks    Status New    Target Date 11/08/19      PT LONG TERM GOAL #2   Title Patient to demonstrate R shoulder AROM WFL and without pain limiting.    Time 6    Period Weeks    Status New    Target Date 11/08/19      PT LONG TERM GOAL #3   Title Patient to demonstrate cervical AROM WFL and without pain limiting.    Time 6    Period Weeks    Status New    Target Date 11/08/19      PT LONG TERM GOAL #4   Title Patient to demonstrate R wrist strength >/=4+/5 and grip strength symmetrical to opposite UE.    Time 6    Period Weeks    Status New    Target Date 11/08/19      PT LONG TERM GOAL #5   Title Patient to report 80% improvement in ability to reach behind the back.    Time 6    Period Weeks    Status New    Target Date 11/08/19      Additional Long Term Goals   Additional Long Term Goals Yes      PT LONG TERM GOAL #6   Title Patient to report ability to return to weightlifting with modifications as needed.    Time 6    Period Weeks    Status New    Target Date 11/08/19                  Plan - 09/27/19 1211    Clinical Impression Statement Patient is a 43y/o M presenting to OPPT with c/o cervical and R shoulder pain of 1 month duration after sleeping with his arm resting overhead the night before. Pain has centralized from the R arm/hand to the R shoulder and now dealing with remaining weakness and stiffness. Patient reports difficulty with shoulder abduction, reaching behind the back, and decreased R grip strength. Reports N/T down  to the R thumb but without radiation. Patient today presenting with rounded shoulders, limited and painful R shoulder ROM, decreased cervical AROM, decreased R wrist and grip strength, and increased soft tissue restriction and TTP over R infraspinatus, proximal biceps tendon, biceps muscle belly, pec, and L LS. Patient was educated on gentle postural correction and stretching HEP as well as on use of moist heat to address muscular pain and ice for wrist pain.    Personal Factors and Comorbidities Age;Comorbidity 2;Fitness;Past/Current Experience;Profession;Time since onset of injury/illness/exacerbation    Comorbidities GERD, R hand surgery    Examination-Activity Limitations Sleep;Caring for Others;Carry;Dressing;Hygiene/Grooming;Lift;Reach Overhead    Examination-Participation Restrictions Cleaning;Community Activity;Shop;Driving;Yard Work;Laundry;Meal Prep;Occupation    Stability/Clinical Decision Making Stable/Uncomplicated  Clinical Decision Making Low    Rehab Potential Good    PT Frequency 1x / week    PT Duration 6 weeks    PT Treatment/Interventions ADLs/Self Care Home Management;Cryotherapy;Electrical Stimulation;Iontophoresis 4mg /ml Dexamethasone;Moist Heat;Traction;Therapeutic exercise;Therapeutic activities;Functional mobility training;Ultrasound;Neuromuscular re-education;Patient/family education;Manual techniques;Vasopneumatic Device;Taping;Energy conservation;Dry needling;Passive range of motion    PT Next Visit Plan cervical/shoulder FOTO; reassess HEP; progess R shoulder AAROM, STM to R biceps and deltoid    Consulted and Agree with Plan of Care Patient           Patient will benefit from skilled therapeutic intervention in order to improve the following deficits and impairments:  Hypomobility, Increased edema, Decreased activity tolerance, Decreased strength, Increased fascial restricitons, Pain, Impaired UE functional use, Increased muscle spasms, Improper body mechanics,  Decreased range of motion, Impaired flexibility, Postural dysfunction  Visit Diagnosis: Cervicalgia  Acute pain of right shoulder  Stiffness of right shoulder, not elsewhere classified  Other symptoms and signs involving the musculoskeletal system     Problem List Patient Active Problem List   Diagnosis Date Noted  . Injury of right thumb 11/09/2016  . Onychomycosis 11/09/2016  . BMI 30.0-30.9,adult 03/14/2016  . Visit for preventive health examination 08/31/2014  . Prostate cancer screening 08/31/2014  . Acid reflux 08/28/2014  . H/O gastrointestinal disease 08/28/2014     Anette GuarneriYevgeniya Nadean Montanaro, PT, DPT 09/27/19 12:21 PM   [facility] [address]  [address] [place], [place], [address] Phone: [phone]   Fax:  [phone]  Name: Jeffrey Gregory MRN: [id number] Date of Birth: 1975-04-14

## 2019-09-28 ENCOUNTER — Other Ambulatory Visit: Payer: Self-pay | Admitting: Physician Assistant

## 2019-09-28 ENCOUNTER — Other Ambulatory Visit: Payer: Managed Care, Other (non HMO)

## 2019-09-28 DIAGNOSIS — Z6830 Body mass index (BMI) 30.0-30.9, adult: Secondary | ICD-10-CM

## 2019-09-28 DIAGNOSIS — E6609 Other obesity due to excess calories: Secondary | ICD-10-CM

## 2019-09-28 MED ORDER — PHENTERMINE HCL 37.5 MG PO TABS: 37.5000 mg | ORAL_TABLET | Freq: Every day | ORAL | 0 refills | Status: DC

## 2019-09-28 NOTE — Progress Notes (Signed)
BP 123/78 Pulse 76  Refilled phentermine.

## 2019-10-05 ENCOUNTER — Other Ambulatory Visit: Payer: Self-pay

## 2019-10-05 ENCOUNTER — Ambulatory Visit
Admission: RE | Admit: 2019-10-05 | Discharge: 2019-10-05 | Disposition: A | Discharge status: Home / Self Care | Payer: Managed Care, Other (non HMO) | Source: Ambulatory Visit | Attending: Neurological Surgery | Admitting: Neurological Surgery

## 2019-10-05 DIAGNOSIS — M5412 Radiculopathy, cervical region: Secondary | ICD-10-CM

## 2019-10-05 MED ORDER — TRIAMCINOLONE ACETONIDE 40 MG/ML IJ SUSP (RADIOLOGY)
60.0000 mg | Freq: Once | INTRAMUSCULAR | Status: AC
  Administered 2019-10-05: 60 mg via EPIDURAL

## 2019-10-05 MED ORDER — IOPAMIDOL (ISOVUE-M 300) INJECTION 61%
1.0000 mL | Freq: Once | INTRAMUSCULAR | Status: AC
  Administered 2019-10-05: 1 mL via EPIDURAL

## 2019-10-05 NOTE — Discharge Instructions (Signed)

## 2019-10-10 ENCOUNTER — Ambulatory Visit: Payer: Managed Care, Other (non HMO)

## 2019-10-15 ENCOUNTER — Ambulatory Visit: Payer: Managed Care, Other (non HMO) | Attending: Neurological Surgery

## 2019-10-15 DIAGNOSIS — M6281 Muscle weakness (generalized): Secondary | ICD-10-CM | POA: Insufficient documentation

## 2019-10-15 DIAGNOSIS — M25511 Pain in right shoulder: Secondary | ICD-10-CM | POA: Insufficient documentation

## 2019-10-15 DIAGNOSIS — M542 Cervicalgia: Secondary | ICD-10-CM | POA: Insufficient documentation

## 2019-10-15 DIAGNOSIS — M25611 Stiffness of right shoulder, not elsewhere classified: Secondary | ICD-10-CM | POA: Insufficient documentation

## 2019-10-15 DIAGNOSIS — R29898 Other symptoms and signs involving the musculoskeletal system: Secondary | ICD-10-CM | POA: Insufficient documentation

## 2019-10-24 ENCOUNTER — Encounter: Payer: Managed Care, Other (non HMO) | Admitting: Physical Therapy

## 2019-10-31 ENCOUNTER — Encounter: Payer: Self-pay | Admitting: Physical Therapy

## 2019-10-31 ENCOUNTER — Other Ambulatory Visit: Payer: Self-pay

## 2019-10-31 ENCOUNTER — Ambulatory Visit: Payer: Managed Care, Other (non HMO) | Admitting: Physical Therapy

## 2019-10-31 DIAGNOSIS — M6281 Muscle weakness (generalized): Secondary | ICD-10-CM | POA: Diagnosis present

## 2019-10-31 DIAGNOSIS — M25611 Stiffness of right shoulder, not elsewhere classified: Secondary | ICD-10-CM

## 2019-10-31 DIAGNOSIS — R29898 Other symptoms and signs involving the musculoskeletal system: Secondary | ICD-10-CM

## 2019-10-31 DIAGNOSIS — M542 Cervicalgia: Secondary | ICD-10-CM | POA: Diagnosis present

## 2019-10-31 DIAGNOSIS — M25511 Pain in right shoulder: Secondary | ICD-10-CM

## 2019-10-31 NOTE — Therapy (Signed)
[facility] [address]  [place], [place], [address] Phone: [phone]   Fax:  [phone]  Physical Therapy Treatment  Patient Details  Name: Jeffrey Gregory MRN: [id number] Date of Birth: 12/08/1975 Referring Provider (PT): Elwin Sleight, DO   Encounter Date: 10/31/2019   PT End of Session - 10/31/19 1100    Visit Number 2    Number of Visits 8    Date for PT Re-Evaluation 12/12/19    Authorization Type [employer]    PT Start Time 1020    PT Stop Time 1058    PT Time Calculation (min) 38 min    Activity Tolerance Patient tolerated treatment well    Behavior During Therapy [facility] for tasks assessed/performed           Past Medical History:  Diagnosis Date  . Esophageal stricture   . GERD (gastroesophageal reflux disease)     Past Surgical History:  Procedure Laterality Date  . ESOPHAGEAL DILATION    . HAND SURGERY Right   . HEMORRHOID SURGERY      There were no vitals filed for this visit.   Subjective Assessment - 10/31/19 1021    Subjective Patient reporting no improvements- notes that he has not stuck to his regimen but has still done some of his exercises. Also now noting that he is able to notice some atrophy in his R posterior shoulder which he is concerned about.    Pertinent History GERD, R hand surgery    Diagnostic tests none recent    Patient Stated Goals work on shoulder mobility    Currently in Pain? No/denies              [facility] PT Assessment - 10/31/19 0001      Assessment   Medical Diagnosis Acute cervical radiculopathy    Referring Provider (PT) Elwin Sleight, DO    Onset Date/Surgical Date 08/30/19      AROM   Right Shoulder Flexion 161 Degrees   tight   Right Shoulder ABduction 166 Degrees    Right Shoulder Internal Rotation --   FIR T5; mild pain   Right Shoulder External Rotation --   FER T3     Strength   Right Shoulder Flexion 4/5    Right Shoulder ABduction 4/5    Right  Shoulder Internal Rotation 4+/5    Right Shoulder External Rotation 4/5                         [facility] Adult PT Treatment/Exercise - 10/31/19 0001      Exercises   Exercises Shoulder      Shoulder Exercises: Seated   External Rotation Strengthening;Right;10 reps;Theraband    Theraband Level (Shoulder External Rotation) Level 3 (Green)    External Rotation Limitations elbow at side    Flexion Strengthening;Right;10 reps;Theraband    Flexion Limitations with yellow TB   shaking at top of movement; cues to avoid straining neck   Abduction Strengthening;Right;10 reps;Theraband    Theraband Level (Shoulder ABduction) Level 1 (Yellow)    ABduction Limitations thumb up; cues to avoid shoulder hike      Shoulder Exercises: Sidelying   External Rotation Strengthening;Right;10 reps;Weights    External Rotation Weight (lbs) 2    External Rotation Limitations 2 sets; ROM to tolerance   cues to maintain wrist neutral                 PT  Education - 10/31/19 1059    Education Details update to HEP; administered yellow TB    Person(s) Educated Patient    Methods Explanation;Demonstration;Tactile cues;Verbal cues;Handout    Comprehension Verbalized understanding;Returned demonstration            PT Short Term Goals - 10/31/19 1108      PT SHORT TERM GOAL #1   Title Patient to be independent with initial HEP.    Time 3    Period Weeks    Status Achieved    Target Date 10/18/19             PT Long Term Goals - 10/31/19 1108      PT LONG TERM GOAL #1   Title Patient to be independent with advanced HEP.    Time 6    Period Weeks    Status On-going    Target Date 12/12/19      PT LONG TERM GOAL #2   Title Patient to demonstrate R shoulder AROM WFL and without pain limiting.    Time 6    Period Weeks    Status Partially Met   improved in flexion, IR, ER   Target Date 12/12/19      PT LONG TERM GOAL #3   Title Patient to demonstrate cervical AROM WFL  and without pain limiting.    Time 6    Period Weeks    Status On-going   not tested   Target Date 12/12/19      PT LONG TERM GOAL #4   Title Patient to demonstrate R shoulder and wrist strength >/=4+/5 and grip strength symmetrical to opposite UE.    Time 6    Period Weeks    Status On-going   not tested   Target Date 12/12/19      PT LONG TERM GOAL #5   Title Patient to report 80% improvement in ability to reach behind the back.    Time 6    Period Weeks    Status On-going   demonstrating improved ROM and tolerance for this activity   Target Date 12/12/19      PT LONG TERM GOAL #6   Title Patient to report ability to return to weightlifting with modifications as needed.    Time 6    Period Weeks    Status On-going   currently not performing full gym workout d/t his injury   Target Date 12/12/19                 Plan - 10/31/19 1100    Clinical Impression Statement Patient reporting no improvements since initial eval. Reports intermittent compliance with HEP. Also now noting that he is able to notice some atrophy in his R posterior shoulder which he is concerned about. Assessed R shoulder strength and ROM. Patient now demonstrating increased weakness in the shoulder today, including the infraspinatus which did show slight concavity. Shoulder AROM however has improved in flexion, IR, and ER. Worked on addressing areas of remaining pain in the R shoulder, with patient demonstrating muscle fatigue and shaking at end range flexion and abduction with compensatory shoulder hike. Reported increased challenge with resisted shoulder ER in sidelying vs. standing. Spoke to patienyt about possible treatment options, with patient expressing interest in mechanical/manual traction. Plan to try this next visit. Patient reported understanding of new HEP update and without complaints at end of session. Would benefit from additional skilled PT services 1x/week for 6 weeks to address remaining  goals.  Comorbidities GERD, R hand surgery    PT Frequency 1x / week    PT Duration 6 weeks    PT Treatment/Interventions ADLs/Self Care Home Management;Cryotherapy;Electrical Stimulation;Iontophoresis 4mg /ml Dexamethasone;Moist Heat;Traction;Therapeutic exercise;Therapeutic activities;Functional mobility training;Ultrasound;Neuromuscular re-education;Patient/family education;Manual techniques;Vasopneumatic Device;Taping;Energy conservation;Dry needling;Passive range of motion    PT Next Visit Plan mechanical/manual traction next session; progess R shoulder strength, STM to R biceps and deltoid    Consulted and Agree with Plan of Care Patient           Patient will benefit from skilled therapeutic intervention in order to improve the following deficits and impairments:  Hypomobility, Increased edema, Decreased activity tolerance, Decreased strength, Increased fascial restricitons, Pain, Impaired UE functional use, Increased muscle spasms, Improper body mechanics, Decreased range of motion, Impaired flexibility, Postural dysfunction  Visit Diagnosis: Cervicalgia  Acute pain of right shoulder  Stiffness of right shoulder, not elsewhere classified  Muscle weakness (generalized)  Other symptoms and signs involving the musculoskeletal system     Problem List Patient Active Problem List   Diagnosis Date Noted  . Injury of right thumb 11/09/2016  . Onychomycosis 11/09/2016  . BMI 30.0-30.9,adult 03/14/2016  . Visit for preventive health examination 08/31/2014  . Prostate cancer screening 08/31/2014  . Acid reflux 08/28/2014  . H/O gastrointestinal disease 08/28/2014     Janene Harvey, PT, DPT 10/31/19 11:12 AM   [facility] [address]  [place], [place], [address] Phone: [phone]   Fax:  [phone]  Name: Jeffrey Gregory MRN: [id number] Date of Birth: 1975/01/31

## 2019-11-05 ENCOUNTER — Telehealth: Payer: Self-pay | Admitting: Physician Assistant

## 2019-11-05 DIAGNOSIS — E6609 Other obesity due to excess calories: Secondary | ICD-10-CM

## 2019-11-05 DIAGNOSIS — Z6830 Body mass index (BMI) 30.0-30.9, adult: Secondary | ICD-10-CM

## 2019-11-07 ENCOUNTER — Ambulatory Visit: Payer: Managed Care, Other (non HMO)

## 2019-11-07 ENCOUNTER — Other Ambulatory Visit: Payer: Self-pay

## 2019-11-07 DIAGNOSIS — M25511 Pain in right shoulder: Secondary | ICD-10-CM

## 2019-11-07 DIAGNOSIS — M542 Cervicalgia: Secondary | ICD-10-CM

## 2019-11-07 DIAGNOSIS — R29898 Other symptoms and signs involving the musculoskeletal system: Secondary | ICD-10-CM

## 2019-11-07 DIAGNOSIS — M25611 Stiffness of right shoulder, not elsewhere classified: Secondary | ICD-10-CM

## 2019-11-07 DIAGNOSIS — M6281 Muscle weakness (generalized): Secondary | ICD-10-CM

## 2019-11-07 NOTE — Therapy (Signed)
[facility] [address]  [place], [place], [address] Phone: [phone]   Fax:  [phone]  Physical Therapy Treatment  Patient Details  Name: Jeffrey Gregory MRN: [id number] Date of Birth: 11/26/1975 Referring Provider (PT): Elwin Sleight, DO   Encounter Date: 11/07/2019   PT End of Session - 11/07/19 1113    Visit Number 3    Number of Visits 8    Date for PT Re-Evaluation 12/12/19    Authorization Type [employer]    PT Start Time 1101    PT Stop Time 1206    PT Time Calculation (min) 65 min    Activity Tolerance Patient tolerated treatment well    Behavior During Therapy [facility] for tasks assessed/performed           Past Medical History:  Diagnosis Date  . Esophageal stricture   . GERD (gastroesophageal reflux disease)     Past Surgical History:  Procedure Laterality Date  . ESOPHAGEAL DILATION    . HAND SURGERY Right   . HEMORRHOID SURGERY      There were no vitals filed for this visit.   Subjective Assessment - 11/07/19 1107    Subjective Pt. reporting good tolerance for HEP update.    Pertinent History GERD, R hand surgery    Diagnostic tests none recent    Patient Stated Goals work on shoulder mobility    Currently in Pain? No/denies    Pain Score 0-No pain    Pain Location Shoulder    Pain Orientation Right                             [facility] Adult PT Treatment/Exercise - 11/07/19 0001      Shoulder Exercises: Standing   Row Right;10 reps;Theraband;Strengthening    Theraband Level (Shoulder Row) Level 1 (Yellow)      Shoulder Exercises: ROM/Strengthening   UBE (Upper Arm Bike) UBE: lvl 1.0, ,3 min forwards, 3 min backwards     Lat Pull 15 reps    Lat Pull Limitations lats standing 25# row/pulldown     Cybex Row 10 reps    Cybex Row Limitations low and mid; 25#       Shoulder Exercises: Stretch   Other Shoulder Stretches R UT, LS stretch x 30 sec each    cued pt. to have  retracted/dep scap     Modalities   Modalities Traction      Traction   Type of Traction Cervical    Min (lbs) 15    Max (lbs) 20    Hold Time 60    Rest Time 20    Time 11      Manual Therapy   Manual Therapy Manual Traction    Manual therapy comments supine     Manual Traction Trials of manual cervical traction 2 x 30 sec                     PT Short Term Goals - 10/31/19 1108      PT SHORT TERM GOAL #1   Title Patient to be independent with initial HEP.    Time 3    Period Weeks    Status Achieved    Target Date 10/18/19             PT Long Term Goals - 10/31/19 1108      PT LONG TERM GOAL #1  Title Patient to be independent with advanced HEP.    Time 6    Period Weeks    Status On-going    Target Date 12/12/19      PT LONG TERM GOAL #2   Title Patient to demonstrate R shoulder AROM WFL and without pain limiting.    Time 6    Period Weeks    Status Partially Met   improved in flexion, IR, ER   Target Date 12/12/19      PT LONG TERM GOAL #3   Title Patient to demonstrate cervical AROM WFL and without pain limiting.    Time 6    Period Weeks    Status On-going   not tested   Target Date 12/12/19      PT LONG TERM GOAL #4   Title Patient to demonstrate R shoulder and wrist strength >/=4+/5 and grip strength symmetrical to opposite UE.    Time 6    Period Weeks    Status On-going   not tested   Target Date 12/12/19      PT LONG TERM GOAL #5   Title Patient to report 80% improvement in ability to reach behind the back.    Time 6    Period Weeks    Status On-going   demonstrating improved ROM and tolerance for this activity   Target Date 12/12/19      PT LONG TERM GOAL #6   Title Patient to report ability to return to weightlifting with modifications as needed.    Time 6    Period Weeks    Status On-going   currently not performing full gym workout d/t his injury   Target Date 12/12/19                 Plan - 11/07/19 1120     Clinical Impression Statement Jeffrey Gregory doing ok.  notes no issues performing updated HEP.  Does report he's been going to the gym however avoiding overhead lifting as not to over-stress cervical spine.  Did discuss avoiding shrugs, heavy UE lifting in general in gym to avoid over-stressing cervical spine with pt. verbalizing understanding.  Trialed manual cervical traction with pt. having good tolerance thus progressed to trial of mechanical cervical traction per pt. request at 20#/15# 20 dg cervical flexion pull in hooklying positioning.  Pt. tolerating traction well and instructed to inform therapist at upcoming visit as to response.    Comorbidities GERD, R hand surgery    Rehab Potential Good    PT Frequency 1x / week    PT Duration 6 weeks    PT Treatment/Interventions ADLs/Self Care Home Management;Cryotherapy;Electrical Stimulation;Iontophoresis 4mg /ml Dexamethasone;Moist Heat;Traction;Therapeutic exercise;Therapeutic activities;Functional mobility training;Ultrasound;Neuromuscular re-education;Patient/family education;Manual techniques;Vasopneumatic Device;Taping;Energy conservation;Dry needling;Passive range of motion    PT Next Visit Plan Monitor tolerance to mechanical/manual traction; progess R shoulder strength, STM to R biceps and deltoid    Consulted and Agree with Plan of Care Patient           Patient will benefit from skilled therapeutic intervention in order to improve the following deficits and impairments:  Hypomobility, Increased edema, Decreased activity tolerance, Decreased strength, Increased fascial restricitons, Pain, Impaired UE functional use, Increased muscle spasms, Improper body mechanics, Decreased range of motion, Impaired flexibility, Postural dysfunction  Visit Diagnosis: Cervicalgia  Acute pain of right shoulder  Stiffness of right shoulder, not elsewhere classified  Muscle weakness (generalized)  Other symptoms and signs involving the musculoskeletal  system     Problem List Patient  Active Problem List   Diagnosis Date Noted  . Injury of right thumb 11/09/2016  . Onychomycosis 11/09/2016  . BMI 30.0-30.9,adult 03/14/2016  . Visit for preventive health examination 08/31/2014  . Prostate cancer screening 08/31/2014  . Acid reflux 08/28/2014  . H/O gastrointestinal disease 08/28/2014    Bess Harvest, PTA 11/07/19 12:37 PM   [facility] [address]  [place], [place], [address] Phone: [phone]   Fax:  [phone]  Name: Jeffrey Gregory MRN: [id number] Date of Birth: [birth year]

## 2019-11-14 ENCOUNTER — Other Ambulatory Visit: Payer: Self-pay

## 2019-11-14 ENCOUNTER — Ambulatory Visit: Payer: Managed Care, Other (non HMO) | Attending: Neurological Surgery | Admitting: Physical Therapy

## 2019-11-14 ENCOUNTER — Ambulatory Visit: Payer: Managed Care, Other (non HMO) | Admitting: Physical Therapy

## 2019-11-14 ENCOUNTER — Encounter: Payer: Self-pay | Admitting: Physical Therapy

## 2019-11-14 DIAGNOSIS — M6281 Muscle weakness (generalized): Secondary | ICD-10-CM | POA: Diagnosis present

## 2019-11-14 DIAGNOSIS — M25511 Pain in right shoulder: Secondary | ICD-10-CM | POA: Insufficient documentation

## 2019-11-14 DIAGNOSIS — R29898 Other symptoms and signs involving the musculoskeletal system: Secondary | ICD-10-CM | POA: Diagnosis present

## 2019-11-14 DIAGNOSIS — M25611 Stiffness of right shoulder, not elsewhere classified: Secondary | ICD-10-CM | POA: Insufficient documentation

## 2019-11-14 DIAGNOSIS — M542 Cervicalgia: Secondary | ICD-10-CM | POA: Insufficient documentation

## 2019-11-14 NOTE — Therapy (Signed)
[facility] [address]  [place], [place], [address] Phone: [phone]   Fax:  [phone]  Physical Therapy Treatment  Patient Details  Name: Jeffrey Gregory MRN: [id number] Date of Birth: 30-Jun-1975 Referring Provider (PT): Elwin Sleight, DO   Encounter Date: 11/14/2019   PT End of Session - 11/14/19 1100    Visit Number 4    Number of Visits 8    Date for PT Re-Evaluation 12/12/19    Authorization Type [employer]    PT Start Time 1021   pt late   PT Stop Time 1107    PT Time Calculation (min) 46 min    Activity Tolerance Patient tolerated treatment well    Behavior During Therapy [facility] for tasks assessed/performed           Past Medical History:  Diagnosis Date  . Esophageal stricture   . GERD (gastroesophageal reflux disease)     Past Surgical History:  Procedure Laterality Date  . ESOPHAGEAL DILATION    . HAND SURGERY Right   . HEMORRHOID SURGERY      There were no vitals filed for this visit.   Subjective Assessment - 11/14/19 1022    Subjective Reporting not much new since last session. Most challenging exercise is the sidelying ER. Still noticing the atrophy in the R posterior shoulder. Would like to try traction again.    Pertinent History GERD, R hand surgery    Diagnostic tests none recent    Patient Stated Goals work on shoulder mobility    Currently in Pain? No/denies                             [facility] Adult PT Treatment/Exercise - 11/14/19 0001      Shoulder Exercises: Seated   Horizontal ABduction Strengthening;Both;10 reps;Theraband    Theraband Level (Shoulder Horizontal ABduction) Level 2 (Red)    Horizontal ABduction Limitations 2x10    External Rotation Strengthening;10 reps;Both;Theraband    Theraband Level (Shoulder External Rotation) Level 2 (Red)    External Rotation Limitations 2x10; cues to maintain elbows at sides    Other Seated Exercises prone on red pball  and kneeling on airex B Y's x10, B T's x10   R scap winging     Shoulder Exercises: Standing   Other Standing Exercises B scapular push offs at wall 10x, R UE only 10x   cues to maintain c-spine neutral; very challenging just on R     Shoulder Exercises: ROM/Strengthening   UBE (Upper Arm Bike) UBE: lvl 2.0, ,3 min forwards, 3 min backwards       Traction   Type of Traction Cervical    Min (lbs) 17    Max (lbs) 22    Hold Time 60    Rest Time 20    Time 10                  PT Education - 11/14/19 1100    Education Details update to HEP    Person(s) Educated Patient    Methods Explanation;Demonstration;Tactile cues;Verbal cues;Handout    Comprehension Verbalized understanding;Returned demonstration            PT Short Term Goals - 10/31/19 1108      PT SHORT TERM GOAL #1   Title Patient to be independent with initial HEP.    Time 3    Period Weeks    Status Achieved  Target Date 10/18/19             PT Long Term Goals - 10/31/19 1108      PT LONG TERM GOAL #1   Title Patient to be independent with advanced HEP.    Time 6    Period Weeks    Status On-going    Target Date 12/12/19      PT LONG TERM GOAL #2   Title Patient to demonstrate R shoulder AROM WFL and without pain limiting.    Time 6    Period Weeks    Status Partially Met   improved in flexion, IR, ER   Target Date 12/12/19      PT LONG TERM GOAL #3   Title Patient to demonstrate cervical AROM WFL and without pain limiting.    Time 6    Period Weeks    Status On-going   not tested   Target Date 12/12/19      PT LONG TERM GOAL #4   Title Patient to demonstrate R shoulder and wrist strength >/=4+/5 and grip strength symmetrical to opposite UE.    Time 6    Period Weeks    Status On-going   not tested   Target Date 12/12/19      PT LONG TERM GOAL #5   Title Patient to report 80% improvement in ability to reach behind the back.    Time 6    Period Weeks    Status On-going    demonstrating improved ROM and tolerance for this activity   Target Date 12/12/19      PT LONG TERM GOAL #6   Title Patient to report ability to return to weightlifting with modifications as needed.    Time 6    Period Weeks    Status On-going   currently not performing full gym workout d/t his injury   Target Date 12/12/19                 Plan - 11/14/19 1100    Clinical Impression Statement Patient without new complaints this AM. Notes that he is still noticing the atrophy in his R posterior shoulder. Requesting to try traction again. Worked on progressive posterior shoulder strengthening with minor cueing to maintain proper form. Patient noted good challenge with new ther-ex initiated today. Demonstrated tendency to hike R shoulder with R UE elevation activated in prone, and with visible winging of the scapula visible, particularly with horizontal abduction. Thus, proceeded with weighted scapular protraction, with patient demonstrating muscular fatigue when isolating R UE only. Updated HEP with exercises that were well-tolerated today- patient reported understanding. Ended session with mechanical traction per patient's request. Patient without complaints at end of session.    Comorbidities GERD, R hand surgery    Rehab Potential Good    PT Frequency 1x / week    PT Duration 6 weeks    PT Treatment/Interventions ADLs/Self Care Home Management;Cryotherapy;Electrical Stimulation;Iontophoresis 6m/ml Dexamethasone;Moist Heat;Traction;Therapeutic exercise;Therapeutic activities;Functional mobility training;Ultrasound;Neuromuscular re-education;Patient/family education;Manual techniques;Vasopneumatic Device;Taping;Energy conservation;Dry needling;Passive range of motion    PT Next Visit Plan Monitor tolerance to mechanical/manual traction; progess R shoulder strength, STM to R biceps and deltoid    Consulted and Agree with Plan of Care Patient           Patient will benefit from skilled  therapeutic intervention in order to improve the following deficits and impairments:  Hypomobility, Increased edema, Decreased activity tolerance, Decreased strength, Increased fascial restricitons, Pain, Impaired UE functional use, Increased muscle  spasms, Improper body mechanics, Decreased range of motion, Impaired flexibility, Postural dysfunction  Visit Diagnosis: Cervicalgia  Acute pain of right shoulder  Stiffness of right shoulder, not elsewhere classified  Muscle weakness (generalized)  Other symptoms and signs involving the musculoskeletal system     Problem List Patient Active Problem List   Diagnosis Date Noted  . Injury of right thumb 11/09/2016  . Onychomycosis 11/09/2016  . BMI 30.0-30.9,adult 03/14/2016  . Visit for preventive health examination 08/31/2014  . Prostate cancer screening 08/31/2014  . Acid reflux 08/28/2014  . H/O gastrointestinal disease 08/28/2014     Janene Harvey, PT, DPT 11/14/19 11:47 AM   [facility] [address]  [place], [place], [address] Phone: [phone]   Fax:  [phone]  Name: Jeffrey Gregory MRN: [id number] Date of Birth: 06-27-1975

## 2019-11-21 ENCOUNTER — Ambulatory Visit: Payer: Managed Care, Other (non HMO)

## 2019-11-27 MED ORDER — PHENTERMINE HCL 37.5 MG PO TABS: 37.5000 mg | ORAL_TABLET | Freq: Every day | ORAL | 0 refills | Status: AC

## 2019-11-27 NOTE — Telephone Encounter (Signed)
BP 123/78  HR 88 Lost 12lbs Requesting phentermine refill. Taking 1/2 tablet daily.

## 2019-11-28 ENCOUNTER — Other Ambulatory Visit: Payer: Self-pay

## 2019-11-28 ENCOUNTER — Ambulatory Visit: Payer: Managed Care, Other (non HMO)

## 2019-11-28 DIAGNOSIS — M25511 Pain in right shoulder: Secondary | ICD-10-CM

## 2019-11-28 DIAGNOSIS — M25611 Stiffness of right shoulder, not elsewhere classified: Secondary | ICD-10-CM

## 2019-11-28 DIAGNOSIS — R29898 Other symptoms and signs involving the musculoskeletal system: Secondary | ICD-10-CM

## 2019-11-28 DIAGNOSIS — M542 Cervicalgia: Secondary | ICD-10-CM | POA: Diagnosis not present

## 2019-11-28 DIAGNOSIS — M6281 Muscle weakness (generalized): Secondary | ICD-10-CM

## 2019-11-28 NOTE — Therapy (Addendum)
[facility] [address]  [place], [place], [address] Phone: [phone]   Fax:  [phone]  Physical Therapy Treatment  Patient Details  Name: Jeffrey Gregory MRN: [id number] Date of Birth: February 09, 1975 Referring Provider (PT): Elwin Sleight, DO   Encounter Date: 11/28/2019   PT End of Session - 11/28/19 1021    Visit Number 5    Number of Visits 8    Date for PT Re-Evaluation 12/12/19    Authorization Type [employer]    PT Start Time 1017    PT Stop Time 1112    PT Time Calculation (min) 55 min    Activity Tolerance Patient tolerated treatment well    Behavior During Therapy [facility] for tasks assessed/performed           Past Medical History:  Diagnosis Date  . Esophageal stricture   . GERD (gastroesophageal reflux disease)     Past Surgical History:  Procedure Laterality Date  . ESOPHAGEAL DILATION    . HAND SURGERY Right   . HEMORRHOID SURGERY      There were no vitals filed for this visit.   Subjective Assessment - 11/28/19 1020    Subjective Pt. noting no recent pain and feels much improved status.    Pertinent History GERD, R hand surgery    Diagnostic tests none recent    Patient Stated Goals work on shoulder mobility    Currently in Pain? No/denies    Pain Score 0-No pain    Pain Location Shoulder    Pain Orientation Right              [facility] PT Assessment - 11/28/19 0001      Assessment   Medical Diagnosis Acute cervical radiculopathy    Referring Provider (PT) Elwin Sleight, DO    Onset Date/Surgical Date 08/30/19    Next MD Visit jan      AROM   AROM Assessment Site Shoulder;Cervical    Right Shoulder Flexion 168 Degrees    Right Shoulder ABduction 163 Degrees    Right Shoulder Internal Rotation --   FIR to T4 - "edge of pain"   Right Shoulder External Rotation --   FER to T3 - pain free    Cervical Flexion 50    Cervical Extension 75    Cervical - Right Side Bend 52    Cervical -  Left Side Bend 45    Cervical - Right Rotation 73    Cervical - Left Rotation 78      Strength   Strength Assessment Site Shoulder;Wrist    Right/Left Shoulder Right    Right Shoulder Flexion 5/5    Right Shoulder ABduction 5/5    Right Shoulder Internal Rotation 5/5    Right Shoulder External Rotation 5/5    Right/Left Wrist Right    Right Wrist Flexion 4+/5    Right Wrist Extension 4+/5    Right Wrist Radial Deviation 5/5    Right Wrist Ulnar Deviation 5/5    Right Hand Grip (lbs) --   [facility]   Left Hand Grip (lbs) --   [facility]                        [facility] Adult PT Treatment/Exercise - 11/28/19 0001      Traction   Type of Traction Cervical    Min (lbs) 19    Max (lbs) 24    Hold Time 60  Rest Time 20    Time 12                  PT Education - 11/28/19 1205    Education Details HEP update    Person(s) Educated Patient    Methods Explanation;Demonstration;Verbal cues;Handout    Comprehension Verbalized understanding;Returned demonstration;Verbal cues required            PT Short Term Goals - 10/31/19 1108      PT SHORT TERM GOAL #1   Title Patient to be independent with initial HEP.    Time 3    Period Weeks    Status Achieved    Target Date 10/18/19             PT Long Term Goals - 11/28/19 1022      PT LONG TERM GOAL #1   Title Patient to be independent with advanced HEP.    Time 6    Period Weeks    Status Achieved      PT LONG TERM GOAL #2   Title Patient to demonstrate R shoulder AROM WFL and without pain limiting.    Time 6    Period Weeks    Status Achieved   11/28/19     PT LONG TERM GOAL #3   Title Patient to demonstrate cervical AROM WFL and without pain limiting.    Time 6    Period Weeks    Status Achieved   11/28/19     PT LONG TERM GOAL #4   Title Patient to demonstrate R shoulder and wrist strength >/=4+/5 and grip strength symmetrical to opposite UE.    Time 6    Period Weeks    Status Achieved    11/28/19     PT LONG TERM GOAL #5   Title Patient to report 80% improvement in ability to reach behind the back.    Time 6    Period Weeks    Status Partially Met   11/28/19: notes 50% improvement     PT LONG TERM GOAL #6   Title Patient to report ability to return to weightlifting with modifications as needed.    Time 6    Period Weeks    Status Achieved   11/28/19; back to lifting in gym with modification                Plan - 11/28/19 1023    Clinical Impression Statement Pt. reporting good benefit from PT.  Noting improved neck ROM and requesting continued cervical mechanical traction today.  Wishes to go on 30-day hold from PT with supervising PT approving this plan as pt. has met or partially met all LTGs in therapy.  Pt. tolerated progression of mechanical cervical traction well today and now on 30-day hold from PT.    Comorbidities GERD, R hand surgery    Rehab Potential Good    PT Frequency 1x / week    PT Duration 6 weeks    PT Treatment/Interventions ADLs/Self Care Home Management;Cryotherapy;Electrical Stimulation;Iontophoresis 4mg /ml Dexamethasone;Moist Heat;Traction;Therapeutic exercise;Therapeutic activities;Functional mobility training;Ultrasound;Neuromuscular re-education;Patient/family education;Manual techniques;Vasopneumatic Device;Taping;Energy conservation;Dry needling;Passive range of motion    PT Next Visit Plan 30-day hold    Consulted and Agree with Plan of Care Patient           Patient will benefit from skilled therapeutic intervention in order to improve the following deficits and impairments:  Hypomobility, Increased edema, Decreased activity tolerance, Decreased strength, Increased fascial restricitons, Pain, Impaired UE functional use, Increased  muscle spasms, Improper body mechanics, Decreased range of motion, Impaired flexibility, Postural dysfunction  Visit Diagnosis: Cervicalgia  Acute pain of right shoulder  Stiffness of right shoulder,  not elsewhere classified  Muscle weakness (generalized)  Other symptoms and signs involving the musculoskeletal system     Problem List Patient Active Problem List   Diagnosis Date Noted  . Injury of right thumb 11/09/2016  . Onychomycosis 11/09/2016  . BMI 30.0-30.9,adult 03/14/2016  . Visit for preventive health examination 08/31/2014  . Prostate cancer screening 08/31/2014  . Acid reflux 08/28/2014  . H/O gastrointestinal disease 08/28/2014    Bess Harvest, PTA 11/28/19 12:06 PM   [facility] [address]  [place], [place], [address] Phone: [phone]   Fax:  [phone]  Name: Jeffrey Gregory MRN: [id number] Date of Birth: [birth year]  PHYSICAL THERAPY DISCHARGE SUMMARY  Visits from Start of Care: 5  Current functional level related to goals / functional outcomes: See above clinical impression; patient did not return during 30 day hold   Remaining deficits: Remaining difficulty reaching behind the back   Education / Equipment: HEP  Plan: Patient agrees to discharge.  Patient goals were partially met. Patient is being discharged due to the patient's request.  ?????     Janene Harvey, PT, DPT 01/14/20 11:59 AM

## 2021-10-18 IMAGING — XA DG INJECT/[PERSON_NAME] INC NEEDLE/CATH/PLC EPI/CERV/THOR W/IMG
2 series · 2 of 2 positions shown · non-contrast
Comparison: none

CLINICAL DATA: Cervical radiculopathy. Displacement of the C5-6
cervical disc.

[Series 1: ortho adipose · 1 of 1 slices shown (1 of 2)]
[im 1/1]
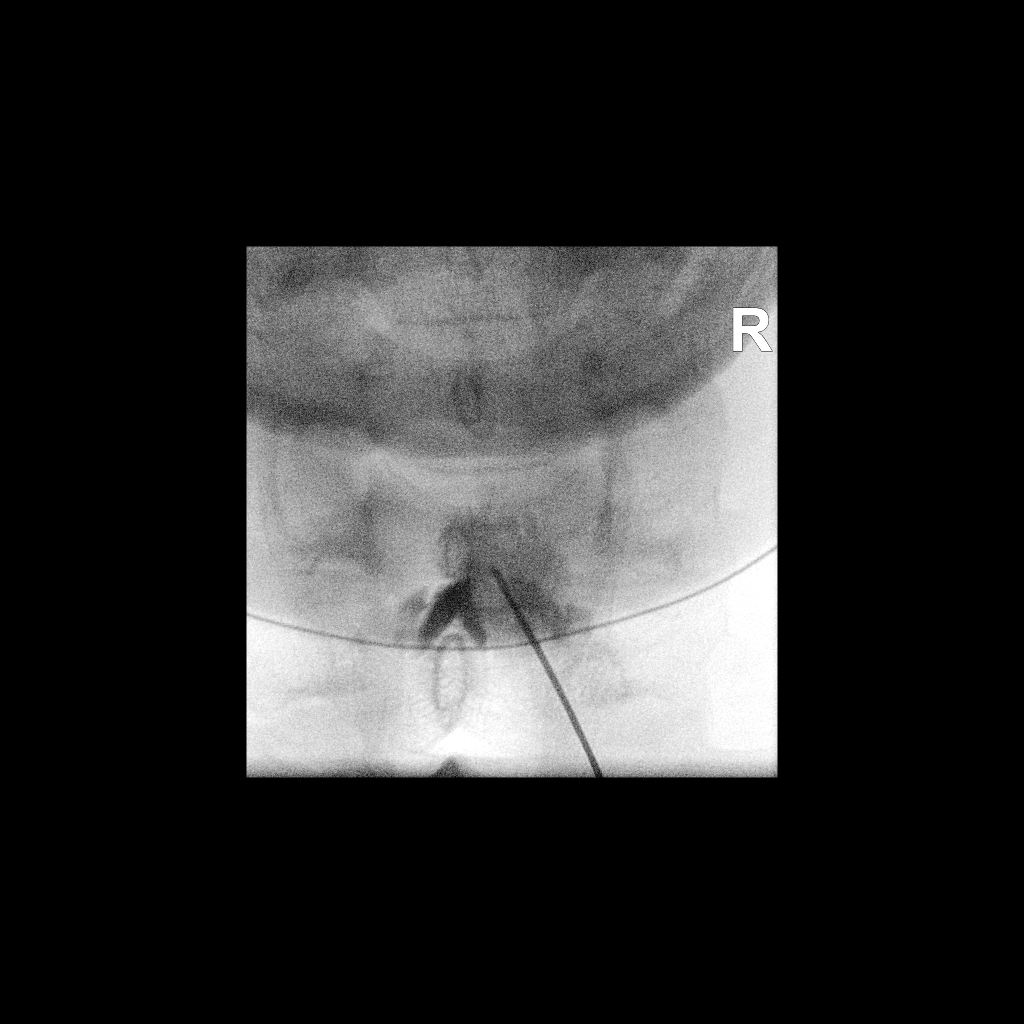

[Series 2: ortho adipose · 1 of 1 slices shown (2 of 2)]
[im 1/1]
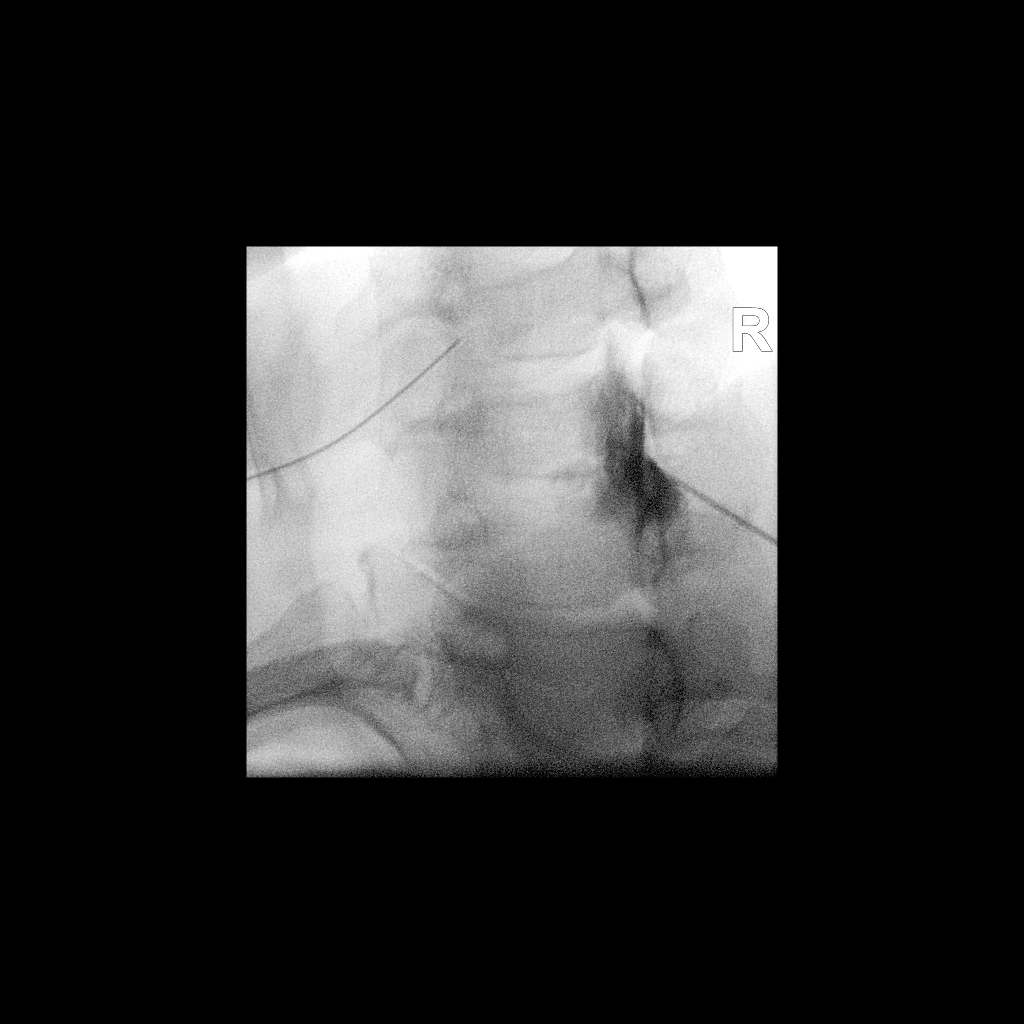

[2 of 2 positions shown; findings below may reference images not displayed]

FLUOROSCOPY TIME:  Radiation Exposure Index (as provided by the
fluoroscopic device): 20.94 uGy*m2

PROCEDURE:
CERVICAL EPIDURAL INJECTION

An interlaminar approach was performed on the right at C6-7. A 20
gauge epidural needle was advanced using loss-of-resistance
technique.

DIAGNOSTIC EPIDURAL INJECTION

Injection of Isovue-M 300 shows a good epidural pattern with spread
above and below the level of needle placement, primarily on the
right. No vascular opacification is seen. THERAPEUTIC

EPIDURAL INJECTION

1.5 ml of Kenalog 40 mixed with 1 ml of 1% Lidocaine and 2 ml of
normal saline were then instilled. The procedure was well-tolerated,
and the patient was discharged thirty minutes following the
injection in good condition.
IMPRESSION: Technically successful first epidural injection on the right at
C6-7.
